# Patient Record
Sex: Male | Born: 2000 | Race: White | Hispanic: No | Marital: Single | Smoking: Never smoker
Health system: Southern US, Community
[De-identification: ages and names within clinical notes are randomized; demographics above are authoritative.]

## PROBLEM LIST (undated history)

## (undated) DIAGNOSIS — M7918 Myalgia, other site: Secondary | ICD-10-CM

## (undated) DIAGNOSIS — G930 Cerebral cysts: Secondary | ICD-10-CM

## (undated) DIAGNOSIS — F329 Major depressive disorder, single episode, unspecified: Secondary | ICD-10-CM

## (undated) DIAGNOSIS — F909 Attention-deficit hyperactivity disorder, unspecified type: Secondary | ICD-10-CM

## (undated) DIAGNOSIS — F410 Panic disorder [episodic paroxysmal anxiety] without agoraphobia: Secondary | ICD-10-CM

## (undated) DIAGNOSIS — R42 Dizziness and giddiness: Secondary | ICD-10-CM

## (undated) DIAGNOSIS — R519 Headache, unspecified: Secondary | ICD-10-CM

## (undated) DIAGNOSIS — I10 Essential (primary) hypertension: Secondary | ICD-10-CM

## (undated) DIAGNOSIS — F32A Depression, unspecified: Secondary | ICD-10-CM

## (undated) DIAGNOSIS — F419 Anxiety disorder, unspecified: Secondary | ICD-10-CM

## (undated) DIAGNOSIS — R51 Headache: Secondary | ICD-10-CM

## (undated) DIAGNOSIS — M359 Systemic involvement of connective tissue, unspecified: Secondary | ICD-10-CM

## (undated) DIAGNOSIS — G894 Chronic pain syndrome: Secondary | ICD-10-CM

## (undated) DIAGNOSIS — F4310 Post-traumatic stress disorder, unspecified: Secondary | ICD-10-CM

## (undated) HISTORY — DX: Major depressive disorder, single episode, unspecified: F32.9

## (undated) HISTORY — DX: Depression, unspecified: F32.A

## (undated) HISTORY — DX: Headache, unspecified: R51.9

## (undated) HISTORY — PX: TONSILLECTOMY AND ADENOIDECTOMY: SUR1326

## (undated) HISTORY — DX: Cerebral cysts: G93.0

## (undated) HISTORY — PX: WISDOM TOOTH EXTRACTION: SHX21

## (undated) HISTORY — DX: Headache: R51

## (undated) HISTORY — DX: Attention-deficit hyperactivity disorder, unspecified type: F90.9

## (undated) HISTORY — DX: Anxiety disorder, unspecified: F41.9

## (undated) HISTORY — DX: Post-traumatic stress disorder, unspecified: F43.10

---
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
## 2019-01-08 ENCOUNTER — Other Ambulatory Visit (INDEPENDENT_AMBULATORY_CARE_PROVIDER_SITE_OTHER): Payer: Self-pay | Admitting: Pediatrics

## 2019-01-08 DIAGNOSIS — R42 Dizziness and giddiness: Secondary | ICD-10-CM

## 2019-01-08 DIAGNOSIS — G43109 Migraine with aura, not intractable, without status migrainosus: Secondary | ICD-10-CM

## 2019-01-26 ENCOUNTER — Ambulatory Visit: Payer: Medicaid Other | Admitting: Child and Adolescent Psychiatry

## 2019-02-06 ENCOUNTER — Other Ambulatory Visit: Payer: Self-pay | Admitting: Child and Adolescent Psychiatry

## 2019-02-06 DIAGNOSIS — G4709 Other insomnia: Secondary | ICD-10-CM

## 2019-02-06 DIAGNOSIS — F900 Attention-deficit hyperactivity disorder, predominantly inattentive type: Secondary | ICD-10-CM

## 2019-02-11 ENCOUNTER — Telehealth: Payer: Self-pay

## 2019-02-11 DIAGNOSIS — F900 Attention-deficit hyperactivity disorder, predominantly inattentive type: Secondary | ICD-10-CM

## 2019-02-11 MED ORDER — LISDEXAMFETAMINE DIMESYLATE 40 MG PO CAPS: 40.0000 mg | ORAL_CAPSULE | ORAL | 0 refills | Status: DC

## 2019-02-11 NOTE — Telephone Encounter (Signed)
Medication refill - message left for patient Dr. Jerold Coombe had sent in a refill of his Vyvanse as requested to his [facility].

## 2019-02-11 NOTE — Telephone Encounter (Signed)
Medication management - Patient left a message he is in need of a refill of his Vyvanse.  States out several days and needs it for school.  Pt. no showed appt. 01/26/19 and is rescheduled for 03/10/19
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
## 2019-02-23 ENCOUNTER — Encounter: Payer: Self-pay | Admitting: Emergency Medicine

## 2019-02-23 ENCOUNTER — Other Ambulatory Visit: Payer: Self-pay

## 2019-02-23 DIAGNOSIS — Z5321 Procedure and treatment not carried out due to patient leaving prior to being seen by health care provider: Secondary | ICD-10-CM | POA: Insufficient documentation

## 2019-02-23 DIAGNOSIS — J029 Acute pharyngitis, unspecified: Secondary | ICD-10-CM | POA: Insufficient documentation

## 2019-02-23 LAB — INFLUENZA PANEL BY PCR (TYPE A & B)
Influenza A By PCR: NEGATIVE
Influenza B By PCR: NEGATIVE

## 2019-02-23 LAB — GROUP A STREP BY PCR: Group A Strep by PCR: NOT DETECTED

## 2019-02-23 NOTE — ED Triage Notes (Signed)
Patient ambulatory to triage with steady gait, without difficulty or distress noted, mask in place; pt reports sore throat, fever, cough & congestion x 3 days; ibuprofen taken PTA

## 2019-02-24 ENCOUNTER — Emergency Department
Admission: EM | Admit: 2019-02-24 | Discharge: 2019-02-24 | Disposition: A | Discharge status: Home / Self Care | Payer: Self-pay | Attending: Emergency Medicine | Admitting: Emergency Medicine

## 2019-03-01 ENCOUNTER — Other Ambulatory Visit: Payer: Self-pay | Admitting: Child and Adolescent Psychiatry

## 2019-03-01 DIAGNOSIS — F418 Other specified anxiety disorders: Secondary | ICD-10-CM

## 2019-03-01 DIAGNOSIS — G4709 Other insomnia: Secondary | ICD-10-CM

## 2019-03-01 DIAGNOSIS — F900 Attention-deficit hyperactivity disorder, predominantly inattentive type: Secondary | ICD-10-CM

## 2019-03-10 ENCOUNTER — Ambulatory Visit: Payer: Medicaid Other | Admitting: Physician Assistant

## 2019-03-15 ENCOUNTER — Other Ambulatory Visit: Payer: Self-pay | Admitting: Child and Adolescent Psychiatry

## 2019-03-15 DIAGNOSIS — F331 Major depressive disorder, recurrent, moderate: Secondary | ICD-10-CM

## 2019-03-16 NOTE — Telephone Encounter (Signed)
Can you please call this patient and request an appointment. I am sending the rx today but please let the patient know that we will not be able to prescribe medication in future without appointment. We can do telepsychiatry appointment.

## 2019-04-03 ENCOUNTER — Other Ambulatory Visit: Payer: Self-pay | Admitting: Child and Adolescent Psychiatry

## 2019-04-03 DIAGNOSIS — F900 Attention-deficit hyperactivity disorder, predominantly inattentive type: Secondary | ICD-10-CM

## 2019-04-04 NOTE — Telephone Encounter (Signed)
Thanks

## 2019-04-04 NOTE — Telephone Encounter (Signed)
Hi Jessica;  I am approving the requested medication for this patient. Can you please call the patient and request to schedule an appointment for follow up evaluation and to be able to continue provide refills in the future.   Thanks

## 2019-04-04 NOTE — Telephone Encounter (Signed)
Pt has appt for the 04-07-19

## 2019-04-07 ENCOUNTER — Other Ambulatory Visit: Payer: Self-pay

## 2019-04-07 ENCOUNTER — Encounter: Payer: Self-pay | Admitting: Child and Adolescent Psychiatry

## 2019-04-07 ENCOUNTER — Ambulatory Visit (INDEPENDENT_AMBULATORY_CARE_PROVIDER_SITE_OTHER): Payer: BLUE CROSS/BLUE SHIELD | Admitting: Child and Adolescent Psychiatry

## 2019-04-07 DIAGNOSIS — F900 Attention-deficit hyperactivity disorder, predominantly inattentive type: Secondary | ICD-10-CM

## 2019-04-07 DIAGNOSIS — F331 Major depressive disorder, recurrent, moderate: Secondary | ICD-10-CM

## 2019-04-07 DIAGNOSIS — F418 Other specified anxiety disorders: Secondary | ICD-10-CM | POA: Diagnosis not present

## 2019-04-07 DIAGNOSIS — G4709 Other insomnia: Secondary | ICD-10-CM

## 2019-04-07 DIAGNOSIS — F4310 Post-traumatic stress disorder, unspecified: Secondary | ICD-10-CM

## 2019-04-07 MED ORDER — HYDROXYZINE HCL 25 MG PO TABS: ORAL_TABLET | ORAL | 0 refills | Status: DC

## 2019-04-07 MED ORDER — LISDEXAMFETAMINE DIMESYLATE 40 MG PO CAPS: 40.0000 mg | ORAL_CAPSULE | ORAL | 0 refills | Status: DC

## 2019-04-07 MED ORDER — QUETIAPINE FUMARATE 25 MG PO TABS: 25.0000 mg | ORAL_TABLET | Freq: Every day | ORAL | 0 refills | Status: DC

## 2019-04-07 MED ORDER — SERTRALINE HCL 50 MG PO TABS: 75.0000 mg | ORAL_TABLET | Freq: Every day | ORAL | 0 refills | Status: DC

## 2019-04-07 MED ORDER — TRAZODONE HCL 100 MG PO TABS: 100.0000 mg | ORAL_TABLET | Freq: Every day | ORAL | 0 refills | Status: DC

## 2019-04-07 MED ORDER — BUPROPION HCL ER (XL) 150 MG PO TB24: ORAL_TABLET | ORAL | 0 refills | Status: DC

## 2019-04-07 NOTE — Progress Notes (Signed)
Virtual Visit via Telephone Note  I connected with Todd Stuart on 04/07/19 at  2:00 PM EDT by telephone and verified that I am speaking with the correct person using two identifiers.   I discussed the limitations, risks, security and privacy concerns of performing an evaluation and management service by telephone and the availability of in person appointments. I also discussed with the patient that there may be a patient responsible charge related to this service. The patient expressed understanding and agreed to proceed.   History of Present Illness: 18 year old with history of anxiety, depression, PTSD, ADHD and sleeping difficulties was evaluated over the telephone for routine follow-up visit.  He reports that he has been having more difficulties with his anxiety, difficulties with his sleep and difficulties with focusing.  He reports that his mood has been stable denies feeling depressed denies anhedonia, denies thoughts of suicide and symptoms of PTSD has improved.  In regards of anxiety he reports that he recently broke up with his girlfriend about 2 to 3 weeks ago which is increased anxiety and contributed to his sleeping difficulties.  He reports that he stays in bed for about 2 or 3 hours before he could fall asleep and reports that he does not have a good quality of sleep.  He reports feeling tired during the day.  He reports that he cannot calm himself down to fall asleep because of  Anxiety and therefore it is hard for him to fall asleep.  He reports that clonidine which was added last visit did not help much.  He reports that he seems to have tolerated Zoloft well and Zoloft has been helpful with his mood along with Wellbutrin.  He reports that he continues to take trazodone and hydroxyzine which has not been helpful as much for sleep.  He also reports that he has been having difficulties with focusing and feels that Vyvanse is not working for him.  We discussed that it could also be  related to his anxiety and poor sleep therefore would recommend to adjust medication for anxiety and sleep before considering medication adjustment for ADHD.  He verbalized understanding.  He reports that he continues to see his therapist every other week.    Observations/Objective:  Appearance: unable to assess since virtual visit was over the telephone Attitude: calm, cooperative Activity: unable to assess since virtual visit was over the telephone Speech: normal rate, rhythm and volume Thought Process: Logical, linear, and goal-directed.  Associations: no looseness, tangentiality, circumstantiality, flight of ideas, thought blocking or word salad noted Thought Content: (abnormal/psychotic thoughts): no abnormal or delusional thought process evidenced SI/HI: denies Si/Hi Perception: no illusions or visual/auditory hallucinations noted; Mood & Affect: "good"/unable to assess since virtual visit was over the telephone  Judgment & Insight: both fair Attention and Concentration : Good Cognition : WNL Language : Good ADL - Intact    Assessment and Plan:  Todd Stuart is an 18 year old malewith complex medical and psychiatric hx. 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 He has improvement in his depression with Zoloft but reports worsening of anxiety and sleeping difficulties along with poor attention.   Treatment Plan Summary: Problem 1:Anxiety; Worse Plan:- Increase Zoloft to 75 mg Qdaily.           - Continue with Wellbutrin XL 150 mg daily since pt had responded well perviously. Continue to monitor BP          -  Recommending Atarax 12.5-25 mg TID PRN for anxiety/sleep          - Continue with Trazodone 100 mg QHS for sleep - Continue Ind therapy with Ms. Sprouse([phone])every 2 weeks, pt has been regularly seeing therapist according to  him  Problem 2:Depression; partially improving Plan:- Continue Zoloft and wellbutrin as mentioned above along with continuing ind therapy every week  Problem 3:PTSD: Improving Plan:- Continue Zoloft and Wellbutrin as mentioned above along with ind therapy.  Problem 4:ADHD: Stable Plan:- Continue Vyvanse 40 mg once daily, VS reviewed and WNL - PMP AWARE checked prior to sending rx, no abuse or misuse noted.   Problem 5: Sleeping difficulties; Worse Plan: - Stop Clonidine 0.1 mg QHS          -  Start Seroquel 25 mg QHS, discussed risks and benefits including metabolic side effects and EPS. Pt provided informed consent.           - Continue Trazodone 100 mg QHS, side effects including but not limited to priapism discussed. Pt provided informed consent. - Atarax upto 25 mg QHS.  30 minutes with patient and more than 50% time spent on counseling and coordination of care as mentioned above.   Follow Up Instructions:    I discussed the assessment and treatment plan with the patient. The patient was provided an opportunity to ask questions and all were answered. The patient agreed with the plan and demonstrated an understanding of the instructions.   The patient was advised to call back or seek an in-person evaluation if the symptoms worsen or if the condition fails to improve as anticipated.  I provided 30 minutes of non-face-to-face time during this encounter.   Darcel SmallingHiren M Domenica Weightman, MD

## 2019-04-12 ENCOUNTER — Telehealth: Payer: Self-pay | Admitting: Physician Assistant

## 2019-04-12 ENCOUNTER — Ambulatory Visit: Payer: Self-pay | Admitting: Physician Assistant

## 2019-04-12 NOTE — Telephone Encounter (Signed)
Went to [email or web address] site, patient was not in waiting room at 11:18 am for appointment. Our office has been calling him multiple times without answer. Called at 11:18 AM AM and patient answered, I introduced myself and patient immediately starts talking about his issues. I informed patient that he is not on the correct forum for visit. I informed patient that he would have to be seen in person to establish, he has appointment on 05/10/2019. He would like to keep this.

## 2019-05-10 ENCOUNTER — Ambulatory Visit: Payer: Self-pay | Admitting: Physician Assistant

## 2019-05-10 ENCOUNTER — Other Ambulatory Visit: Payer: Self-pay | Admitting: Child and Adolescent Psychiatry

## 2019-05-10 DIAGNOSIS — G4709 Other insomnia: Secondary | ICD-10-CM

## 2019-05-10 DIAGNOSIS — F418 Other specified anxiety disorders: Secondary | ICD-10-CM

## 2019-05-11 ENCOUNTER — Ambulatory Visit: Payer: Medicaid Other | Admitting: Child and Adolescent Psychiatry

## 2019-05-11 ENCOUNTER — Other Ambulatory Visit: Payer: Self-pay

## 2019-08-26 ENCOUNTER — Ambulatory Visit (LOCAL_COMMUNITY_HEALTH_CENTER): Payer: Self-pay

## 2019-08-26 ENCOUNTER — Other Ambulatory Visit: Payer: Self-pay

## 2019-08-26 DIAGNOSIS — Z111 Encounter for screening for respiratory tuberculosis: Secondary | ICD-10-CM

## 2019-08-29 ENCOUNTER — Ambulatory Visit (LOCAL_COMMUNITY_HEALTH_CENTER): Payer: Medicaid Other

## 2019-08-29 ENCOUNTER — Other Ambulatory Visit: Payer: Self-pay

## 2019-08-29 DIAGNOSIS — Z111 Encounter for screening for respiratory tuberculosis: Secondary | ICD-10-CM

## 2019-08-29 LAB — TB SKIN TEST
Induration: 0 mm
TB Skin Test: NEGATIVE

## 2020-03-23 ENCOUNTER — Ambulatory Visit (INDEPENDENT_AMBULATORY_CARE_PROVIDER_SITE_OTHER): Payer: Medicaid Other | Admitting: Internal Medicine

## 2020-03-23 ENCOUNTER — Encounter: Payer: Self-pay | Admitting: Internal Medicine

## 2020-03-23 ENCOUNTER — Other Ambulatory Visit: Payer: Self-pay

## 2020-03-23 DIAGNOSIS — F900 Attention-deficit hyperactivity disorder, predominantly inattentive type: Secondary | ICD-10-CM

## 2020-03-23 DIAGNOSIS — I10 Essential (primary) hypertension: Secondary | ICD-10-CM | POA: Diagnosis not present

## 2020-03-23 DIAGNOSIS — F411 Generalized anxiety disorder: Secondary | ICD-10-CM | POA: Diagnosis not present

## 2020-03-23 MED ORDER — LISINOPRIL 2.5 MG PO TABS: 2.5000 mg | ORAL_TABLET | Freq: Every day | ORAL | 3 refills | Status: DC

## 2020-03-23 MED ORDER — CLONIDINE HCL 0.1 MG PO TABS: 0.1000 mg | ORAL_TABLET | Freq: Every day | ORAL | 3 refills | Status: AC

## 2020-03-23 MED ORDER — DULOXETINE HCL 60 MG PO CPEP: 60.0000 mg | ORAL_CAPSULE | Freq: Every day | ORAL | 1 refills | Status: DC

## 2020-03-23 NOTE — Progress Notes (Signed)
Subjective:    Patient ID: Todd Stuart, male    DOB: February 10, 2001, 19 y.o.   MRN: [id number]  HPI Here to establish care This visit occurred during the SARS-CoV-2 public health emergency.  Safety protocols were in place, including screening questions prior to the visit, additional usage of staff PPE, and extensive cleaning of exam room while observing appropriate contact time as indicated for disinfecting solutions.   Has been hospitalized in the past for high blood pressure [facility] No clear diagnosis---but ?Ehlers-Danlos Has done pretty well with his dual Rx  Also with chronic anxiety Will have sweats, etc This is now affecting his school This affects his sleep---the clonidine seems to help that No regular depression Cymbalta, zoloft, prozac in the past  Also diagnosed with ADHD vyvanse in the past---but off this  Currently in [employer] Hopes to go into nursing  Current Outpatient Medications on File Prior to Visit  Medication Sig Dispense Refill  . cloNIDine (CATAPRES) 0.1 MG tablet Take 1 tablet by mouth daily.    Marland Kitchen lisinopril (ZESTRIL) 2.5 MG tablet Take 2.5 mg by mouth daily.     No current facility-administered medications on file prior to visit.    Allergies  Allergen Reactions  . Indomethacin Itching, Nausea Only and Shortness Of Breath    Patient saw PCP who advised stopping medication.    Past Medical History:  Diagnosis Date  . ADHD (attention deficit hyperactivity disorder)   . Amplified musculoskeletal pain syndrome   . Anxiety   . 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 Chronic pain syndrome   . Connective tissue disease ([place])   . Depression   . Disequilibrium   . Headache   . Hypertension   . Hypertension   . Panic attack   . PTSD (post-traumatic stress disorder)     Past Surgical History:  Procedure Laterality Date  . TONSILLECTOMY AND ADENOIDECTOMY    . WISDOM TOOTH EXTRACTION      Family History  Problem Relation Age  of Onset  . Depression Mother   . Headache Sister   . Seizures Neg Hx   . Anxiety disorder Neg Hx   . Bipolar disorder Neg Hx   . Schizophrenia Neg Hx   . ADD / ADHD Neg Hx   . Autism Neg Hx     Social History   Socioeconomic History  . Marital status: Single    Spouse name: Not on file  . Number of children: Not on file  . Years of education: Not on file  . Highest education level: Not on file  Occupational History  . Occupation: Electronics engineer  Tobacco Use  . Smoking status: Never Smoker  . Smokeless tobacco: Never Used  Substance and Sexual Activity  . Alcohol use: Never  . Drug use: Never  . Sexual activity: Yes    Birth control/protection: Condom  Other Topics Concern  . Not on file  Social History Narrative   Chester Holstein currently in 1st year of college ; He lives with his mother, step-father, and siblings.       He enjoys running, watch TV, studying and working when feeling well.       Social Determinants of Health   Financial Resource Strain:   . Difficulty of Paying Living Expenses:   Food Insecurity:   . Worried About Charity fundraiser in the Last Year:   . Ryan in the Last Year:  Transportation Needs:   . Freight forwarder (Medical):   Marland Kitchen Lack of Transportation (Non-Medical):   Physical Activity:   . Days of Exercise per Week:   . Minutes of Exercise per Session:   Stress:   . Feeling of Stress :   Social Connections:   . Frequency of Communication with Friends and Family:   . Frequency of Social Gatherings with Friends and Family:   . Attends Religious Services:   . Active Member of Clubs or Organizations:   . Attends Banker Meetings:   Marland Kitchen Marital Status:   Intimate Partner Violence:   . Fear of Current or Ex-Partner:   . Emotionally Abused:   Marland Kitchen Physically Abused:   . Sexually Abused:    Review of Systems In relationship---uses condoms regularly No sexual issues Walks regularly Not sleeping well--relates to  anxiety (tries melatonin)    Objective:   Physical Exam  Constitutional: He appears well-developed. No distress.  Neck: No thyromegaly present.  Cardiovascular: Normal rate, regular rhythm, normal heart sounds and intact distal pulses. Exam reveals no gallop.  No murmur heard. Respiratory: Effort normal and breath sounds normal. No respiratory distress. He has no wheezes. He has no rales.  GI: Soft. There is no abdominal tenderness.  Musculoskeletal:        General: No edema.  Lymphadenopathy:    He has no cervical adenopathy.  Psychiatric: He has a normal mood and affect. His behavior is normal.           Assessment & Plan:

## 2020-03-23 NOTE — Assessment & Plan Note (Signed)
Will restart duloxetine--try 60mg  but may need to increase

## 2020-03-23 NOTE — Assessment & Plan Note (Signed)
Prefers no vyvanse Doing okay at [facility] now Clonidine at bedtime may help some

## 2020-03-23 NOTE — Patient Instructions (Signed)
Please start the duloxetine with 1 capsule every other day for the first week or so. If you don't have any problems, increase to the full capsule daily

## 2020-03-23 NOTE — Assessment & Plan Note (Signed)
BP Readings from Last 3 Encounters:  03/23/20 104/76  02/23/19 125/81  08/09/18 111/73   Has done well with current low dose regimen

## 2020-03-25 MED ORDER — ONDANSETRON HCL 4 MG PO TABS: 4.0000 mg | ORAL_TABLET | Freq: Three times a day (TID) | ORAL | 1 refills | Status: AC | PRN

## 2020-03-25 NOTE — Addendum Note (Signed)
Addended by: Tillman Abide I on: 03/25/2020 01:22 PM   Modules accepted: Orders

## 2020-03-29 ENCOUNTER — Telehealth: Payer: Self-pay | Admitting: Internal Medicine

## 2020-03-29 MED ORDER — BUSPIRONE HCL 10 MG PO TABS: 10.0000 mg | ORAL_TABLET | Freq: Three times a day (TID) | ORAL | 1 refills | Status: DC

## 2020-03-29 NOTE — Telephone Encounter (Signed)
Spoke to pt. He will let us know if he has any issues 

## 2020-03-29 NOTE — Telephone Encounter (Signed)
Pt called and left a voicemail requesting an alternative medication. Pt was started on Cymbalta on Friday 4/9 and states hat he is not comfortable taking this medication d/t the way it made him feel - he states that it made him feel very bad and he did not like it. Wondering if there is something else that can be sent in.   Please advise, thanks.

## 2020-03-29 NOTE — Telephone Encounter (Signed)
Let him know I sent a prescription for buspirone---which is only for anxiety. Have him start with 1 at bedtime for 3 days--then increase to twice a day (morning /evening) and finally three times a day if no side effects (after 1-2 weeks). Some people will feel a little tired a while after taking it Have him let me know if he is having any problems

## 2020-04-04 MED ORDER — TRAZODONE HCL 50 MG PO TABS: 50.0000 mg | ORAL_TABLET | Freq: Every day | ORAL | 1 refills | Status: DC

## 2020-04-23 ENCOUNTER — Ambulatory Visit: Payer: Medicaid Other | Admitting: Internal Medicine

## 2020-04-26 ENCOUNTER — Telehealth: Payer: Self-pay

## 2020-04-26 NOTE — Telephone Encounter (Signed)
I called pt and unable to reach by phone. Per access nurse note pt will go to  [facility] ED.

## 2020-04-26 NOTE — Telephone Encounter (Signed)
[facility] Day - Client TELEPHONE ADVICE RECORD Access[facility]urse Patient Name: Todd Stuart Gender: Male DOB: [birth year] Age: 19 Y 3 M 13 D Return Phone Number: [phone] (Primary) Address: City/State/Zip: [place] [address] Client [facility] Day - Client Client Site [facility] - Day Physician Tillman Abide - MD Contact Type Call Who Is Calling Patient / Member / Family / Caregiver Call Type Triage / Clinical Relationship To Patient Self Return Phone Number [phone] (Primary) Chief Complaint BREATHING - shortness of breath or sounds breathless Reason for Call Symptomatic / Request for Health Information Initial Comment Caller states he is having a sore throat, SOB, and chest tightness. GOTO Facility Not Listed [facility] ED Translation No Nurse Assessment Nurse: Odis Luster, RN, Bjorn Loser Date/Time (Eastern Time): 04/26/2020 8:53:35 AM Confirm and document reason for call. If symptomatic, describe symptoms. ---Caller states he is having a sore throat, SOB, and chest tightness. s/s began about 5 days ago. Work in an ED. Has the patient had close contact with a person known or suspected to have the novel coronavirus illness OR traveled / lives in area with major community spread (including international travel) in the last 14 days from the onset of symptoms? * If Asymptomatic, screen for exposure and travel within the last 14 days. ---No Does the patient have any new or worsening symptoms? ---Yes Will a triage be completed? ---Yes Related visit to physician within the last 2 weeks? ---No Does the PT have any chronic conditions? (i.e. diabetes, asthma, this includes High risk factors for pregnancy, etc.) ---Yes List chronic conditions. ---HTN Is this a behavioral health or substance abuse call? ---No Guidelines Guideline Title Affirmed Question Affirmed Notes Nurse Date/Time Lamount Cohen Time) Chest  Pain Dizziness or lightheadedness Odis Luster, RN, Bjorn Loser 04/26/2020 8:56:21 AM Disp. Time Lamount Cohen Time) Disposition Final User 04/26/2020 8:51:23 AM Send to Urgent Alphonsa Gin PLEASE NOTE: All timestamps contained within this report are represented as [place] Standard Time. CONFIDENTIALTY NOTICE: This fax transmission is intended only for the addressee. It contains information that is legally privileged, confidential or otherwise protected from use or disclosure. If you are not the intended recipient, you are strictly prohibited from reviewing, disclosing, copying using or disseminating any of this information or taking any action in reliance on or regarding this information. If you have received this fax in error, please notify us immediately by telephone so that we can arrange for its return to [place]. Phone: [phone], Toll-Free: [phone], Fax: [phone] Page: 2 of 2 Call Id: [id number] 04/26/2020 8:58:36 AM Go to ED Now Yes Odis Luster, RN, Juliene Pina Disagree/Comply Comply Caller Understands Yes PreDisposition Go to ED Care Advice Given Per Guideline GO TO ED NOW: * You need to be seen in the Emergency Department. * Go to the ED at ___________ Hospital. * Leave now. Drive carefully. NOTE TO TRIAGER - DRIVING: * Another adult should drive. BRING MEDICINES: * Please bring a list of your current medicines when you go to the Emergency Department (ER). * It is also a good idea to bring the pill bottles too. This will help the doctor to make certain you are taking the right medicines and the right dose. CALL EMS IF: * Severe difficulty breathing occurs * Passes out or becomes too weak to stand * You become worse. CARE ADVICE given per Chest Pain (Adult) guideline. Referrals GO TO FACILITY OTHER - SPECIFY

## 2020-04-26 NOTE — Telephone Encounter (Signed)
Looks like he is at [facility] walk in now Please check on him tomorrow Also need to reschedule the visit he no showed for

## 2020-04-27 NOTE — Telephone Encounter (Signed)
Left a message for the pt to return my call.  

## 2020-04-27 NOTE — Telephone Encounter (Signed)
I spoke with pt and he is presently at [facility] ED.

## 2020-04-27 NOTE — Telephone Encounter (Signed)
[facility] Day - Client TELEPHONE ADVICE RECORD Access[facility] Patient Name: Todd Stuart Gender: Male DOB: May 22, 2001 Age: 19 Y 3 M 14 D Return Phone Number: [phone] (Primary) Address: City/State/Zip: [place] [address] Client [facility] Day - Client Client Site [facility] - Day Physician Tillman Abide - MD Contact Type Call Who Is Calling Patient / Member / Family / Caregiver Call Type Triage / Clinical Relationship To Patient Self Return Phone Number [phone] (Primary) Chief Complaint CHEST PAIN (>=21 years) - pain, pressure, heaviness or tightness Reason for Call Symptomatic / Request for Health Information Initial Comment Caller was told to go to urgent care and he was negative for COVID and Pneumonia. Caller reports a fever of 100, cold chills, and body aches. Caller also reports chest congestion. Caller reports chest pain. Caller reports it is causing back pain. The office did not have an appointment available. GOTO Facility Not Listed [facility] ER. Translation No Nurse Assessment Nurse: Thurmond Butts, RN, Meriam Sprague Date/Time (Eastern Time): 04/27/2020 12:07:52 PM Confirm and document reason for call. If symptomatic, describe symptoms. ---Caller states he went to the urgent care and was negative for covid and pneumonia. He has bad back pain and has had some chest pain. Not having it now. He keeps worsening. Fever and chills. He needs a note for next week at work. Coughing. Last temp 100 temporal. very fatigued. Has the patient had close contact with a person known or suspected to have the novel coronavirus illness OR traveled / lives in area with major community spread (including international travel) in the last 14 days from the onset of symptoms? * If Asymptomatic, screen for exposure and travel within the last 14 days. ---No Does the patient have any new or worsening symptoms?  ---Yes Will a triage be completed? ---Yes Related visit to physician within the last 2 weeks? ---Yes Does the PT have any chronic conditions? (i.e. diabetes, asthma, this includes High risk factors for pregnancy, etc.) ---Yes List chronic conditions. ---asthma Is this a behavioral health or substance abuse call? ---No PLEASE NOTE: All timestamps contained within this report are represented as [place] Standard Time. CONFIDENTIALTY NOTICE: This fax transmission is intended only for the addressee. It contains information that is legally privileged, confidential or otherwise protected from use or disclosure. If you are not the intended recipient, you are strictly prohibited from reviewing, disclosing, copying using or disseminating any of this information or taking any action in reliance on or regarding this information. If you have received this fax in error, please notify us immediately by telephone so that we can arrange for its return to [place]. Phone: [phone], Toll-Free: [phone], Fax: [phone] Page: 2 of 2 Call Id: [id number] Guidelines Guideline Title Affirmed Question Affirmed Notes Nurse Date/Time Lamount Cohen Time) COVID-19 - Diagnosed or Suspected SEVERE or constant chest pain or pressure (Exception: mild central chest pain, present only when coughing) Thurmond Butts, RN, [facility] 04/27/2020 12:11:58 PM Disp. Time Lamount Cohen Time) Disposition Final User 04/27/2020 12:06:52 PM Send to Urgent Queue Simon Rhein 04/27/2020 12:13:38 PM Go to ED Now Yes Thurmond Butts, RN, Diamantina [facility] Disagree/Comply Comply Caller Understands Yes PreDisposition Call Doctor Care Advice Given Per Guideline GO TO ED NOW: CALL EMS 911 IF: * Severe difficulty breathing occurs * Lips or face turns blue * Confusion occurs. CARE ADVICE given per COVID-19 - DIAGNOSED OR SUSPECTED (Adult) guideline. Referrals GO TO FACILITY OTHER - SPECIFY

## 2020-04-29 NOTE — Telephone Encounter (Signed)
Please check on him on Monday We still need to reschedule his follow up about the medication effectiveness It can be virtual

## 2020-04-30 NOTE — Telephone Encounter (Signed)
Left message on VM stating I was calling to see how he was doing after recent ER visit and to R/S the appt he missed th other day.

## 2020-05-02 NOTE — Telephone Encounter (Signed)
Pt has been called multiple times and is not returning our calls.

## 2020-05-04 MED ORDER — BENZONATATE 200 MG PO CAPS: 200.0000 mg | ORAL_CAPSULE | Freq: Three times a day (TID) | ORAL | 0 refills | Status: DC | PRN

## 2020-05-04 NOTE — Telephone Encounter (Signed)
He can make an appt with another provider to be seen, otherwise will defer to PCP

## 2020-05-22 ENCOUNTER — Other Ambulatory Visit: Payer: Self-pay

## 2020-05-22 ENCOUNTER — Encounter: Payer: Self-pay | Admitting: Internal Medicine

## 2020-05-22 ENCOUNTER — Ambulatory Visit (INDEPENDENT_AMBULATORY_CARE_PROVIDER_SITE_OTHER): Payer: Medicaid Other | Admitting: Internal Medicine

## 2020-05-22 DIAGNOSIS — F411 Generalized anxiety disorder: Secondary | ICD-10-CM | POA: Diagnosis not present

## 2020-05-22 DIAGNOSIS — G479 Sleep disorder, unspecified: Secondary | ICD-10-CM | POA: Diagnosis not present

## 2020-05-22 DIAGNOSIS — J4540 Moderate persistent asthma, uncomplicated: Secondary | ICD-10-CM

## 2020-05-22 MED ORDER — TRAZODONE HCL 100 MG PO TABS: 100.0000 mg | ORAL_TABLET | Freq: Every day | ORAL | 1 refills | Status: DC

## 2020-05-22 MED ORDER — TRAZODONE HCL 100 MG PO TABS: 200.0000 mg | ORAL_TABLET | Freq: Every day | ORAL | 1 refills | Status: DC

## 2020-05-22 MED ORDER — BUSPIRONE HCL 10 MG PO TABS: 10.0000 mg | ORAL_TABLET | Freq: Three times a day (TID) | ORAL | 1 refills | Status: DC

## 2020-05-22 MED ORDER — FLUTICASONE-SALMETEROL 250-50 MCG/DOSE IN AEPB: 1.0000 | INHALATION_SPRAY | Freq: Two times a day (BID) | RESPIRATORY_TRACT | 5 refills | Status: AC

## 2020-05-22 NOTE — Progress Notes (Signed)
Subjective:    Patient ID: Todd Stuart, male    DOB: 11-07-2001, 19 y.o.   MRN: [id number]  HPI Here for follow up of anxiety and sleep problems This visit occurred during the SARS-CoV-2 public health emergency.  Safety protocols were in place, including screening questions prior to the visit, additional usage of staff PPE, and extensive cleaning of exam room while observing appropriate contact time as indicated for disinfecting solutions.   Didn't like the duloxetine--felt "really weird, tired" Changed to buspirone 10mg  bid Hasn't seen much of a difference in anxiety with this--but no apparent side effects Ongoing sleep issues are also a problem Started trazodone shortly after---did have good response at first but it waned. 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 busPIRone (BUSPAR) 10 MG tablet Take 1 tablet (10 mg total) by mouth 3 (three) times daily. 90 tablet 1  . cloNIDine (CATAPRES) 0.1 MG tablet Take 1 tablet (0.1 mg total) by mouth at bedtime. 90 tablet 3  . lisinopril (ZESTRIL) 2.5 MG tablet Take 1 tablet (2.5 mg total) by mouth daily. 90 tablet 3  . ondansetron (ZOFRAN) 4 MG tablet Take 1 tablet (4 mg total) by mouth every 8 (eight) hours as needed for nausea or vomiting. 20 tablet 1  . traZODone (DESYREL) 50 MG tablet Take 1-2 tablets (50-100 mg total) by mouth at bedtime. 60 tablet 1   No current facility-administered medications on file prior to visit.    Allergies  Allergen Reactions  . Indomethacin Itching, Nausea Only and Shortness Of Breath    Patient saw PCP who advised stopping  medication.    Past Medical History:  Diagnosis Date  . ADHD (attention deficit hyperactivity disorder)   . Amplified musculoskeletal pain syndrome   . Anxiety   . 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 Chronic pain syndrome   . Connective tissue disease ([place])   . Depression   . Disequilibrium   . Headache   . Hypertension   . Hypertension   . Panic attack   . PTSD (post-traumatic stress disorder)     Past Surgical History:  Procedure Laterality Date  . TONSILLECTOMY AND ADENOIDECTOMY    . WISDOM TOOTH EXTRACTION      Family History  Problem Relation Age of Onset  . Depression Mother   . Depression Father   . Anxiety disorder Father   . Headache Sister   . Seizures Neg Hx   . Bipolar disorder Neg Hx   . Schizophrenia Neg Hx   . ADD / ADHD Neg Hx   . Autism Neg Hx     Social History   Socioeconomic History  . Marital status: Single    Spouse name: Not on file  . Number of children: Not on file  . Years of education: Not on file  . Highest education level: Not on file  Occupational History  . Occupation: Electronics engineer  Tobacco Use  . Smoking status: Never Smoker  . Smokeless tobacco: Never Used  Substance and Sexual Activity  . Alcohol use: Never  .  Drug use: Never  . Sexual activity: Yes    Birth control/protection: Condom  Other Topics Concern  . Not on file  Social History Narrative   Todd Stuart currently in 1st year of college ; He lives with his mother, step-father, and siblings.       He enjoys running, watch TV, studying and working when feeling well.       Social Determinants of Health   Financial Resource Strain:   . Difficulty of Paying Living Expenses:   Food Insecurity:   . Worried About Programme researcher, broadcasting/film/video in the Last Year:   . Barista in the Last Year:   Transportation Needs:   . Freight forwarder (Medical):   Marland Kitchen Lack of Transportation (Non-Medical):   Physical Activity:   . Days of Exercise per Week:   .  Minutes of Exercise per Session:   Stress:   . Feeling of Stress :   Social Connections:   . Frequency of Communication with Friends and Family:   . Frequency of Social Gatherings with Friends and Family:   . Attends Religious Services:   . Active Member of Clubs or Organizations:   . Attends Banker Meetings:   Marland Kitchen Marital Status:   Intimate Partner Violence:   . Fear of Current or Ex-Partner:   . Emotionally Abused:   Marland Kitchen Physically Abused:   . Sexually Abused:    Review of Systems  Appetite is up some--has gained a couple of pounds Continues to exercise regularly In relationship now--this is good     Objective:   Physical Exam  Constitutional: No distress.  Respiratory: Effort normal and breath sounds normal. No respiratory distress. He has no wheezes. He has no rales.  Psychiatric:  Normal interaction No depression Not overtly anxious now           Assessment & Plan:

## 2020-05-22 NOTE — Assessment & Plan Note (Signed)
Not much response to buspirone Will try increasing to 10 tid (and consider further increase to 15 tid)

## 2020-05-22 NOTE — Assessment & Plan Note (Signed)
Now using the albuterol daily Will restart advair

## 2020-05-22 NOTE — Assessment & Plan Note (Signed)
Chronic Will try increasing the trazodone to 200mg  at bedtime Could try mirtazapine if that is ineffective (and be careful about appetite)

## 2020-06-21 ENCOUNTER — Other Ambulatory Visit: Payer: Self-pay

## 2020-06-21 ENCOUNTER — Telehealth (INDEPENDENT_AMBULATORY_CARE_PROVIDER_SITE_OTHER): Payer: Medicaid Other | Admitting: Internal Medicine

## 2020-06-21 ENCOUNTER — Ambulatory Visit: Payer: Medicaid Other | Admitting: Internal Medicine

## 2020-06-21 ENCOUNTER — Encounter: Payer: Self-pay | Admitting: Internal Medicine

## 2020-06-21 DIAGNOSIS — F411 Generalized anxiety disorder: Secondary | ICD-10-CM

## 2020-06-21 MED ORDER — FLUOXETINE HCL 10 MG PO CAPS: 10.0000 mg | ORAL_CAPSULE | Freq: Every day | ORAL | 3 refills | Status: DC

## 2020-06-21 NOTE — Progress Notes (Signed)
Subjective:    Patient ID: Todd Stuart, male    DOB: 09/30/2001, 19 y.o.   MRN: [id number]  HPI Video virtual visit for follow up of his anxiety Identification done Reviewed limitations and billing and he gave consent Participants---patient in the family [facility] office and I am in my office  Initially had video contact but then it was lost so we finished on phone only  Did feel his heart was racing and he was more anxious on the buspirone Had more trouble sleeping These symptoms are better since he stopped  Still with the chronic anxiety---but not the sweating and palpitations On the trazodone 200mg  at bedtime---sleeping better Has some sad days--trouble motivating (but nothing serious)  Total time of call 12 minutes  Current Outpatient Medications on File Prior to Visit  Medication Sig Dispense Refill   cloNIDine (CATAPRES) 0.1 MG tablet Take 1 tablet (0.1 mg total) by mouth at bedtime. 90 tablet 3   Fluticasone-Salmeterol (ADVAIR) 250-50 MCG/DOSE AEPB Inhale 1 puff into the lungs in the morning and at bedtime. 60 each 5   lisinopril (ZESTRIL) 2.5 MG tablet Take 1 tablet (2.5 mg total) by mouth daily. 90 tablet 3   ondansetron (ZOFRAN) 4 MG tablet Take 1 tablet (4 mg total) by mouth every 8 (eight) hours as needed for nausea or vomiting. 20 tablet 1   traZODone (DESYREL) 100 MG tablet Take 2 tablets (200 mg total) by mouth at bedtime. 60 tablet 1   No current facility-administered medications on file prior to visit.    Allergies  Allergen Reactions   Indomethacin Itching, Nausea Only and Shortness Of Breath    Patient saw PCP who advised stopping medication.    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   Chronic pain syndrome    Connective tissue disease (HCC)    Depression    Disequilibrium      Headache    Hypertension    Hypertension    Panic attack    PTSD (post-traumatic stress disorder)     Past Surgical History:  Procedure Laterality Date   TONSILLECTOMY AND ADENOIDECTOMY     WISDOM TOOTH EXTRACTION      Family History  Problem Relation Age of Onset   Depression Mother    Depression Father    Anxiety disorder Father    Headache Sister    Seizures Neg Hx    Bipolar disorder Neg Hx    Schizophrenia Neg Hx    ADD / ADHD Neg Hx    Autism Neg Hx     Social History   Socioeconomic History   Marital status: Single    Spouse name: Not on file   Number of children: Not on file   Years of education: Not on file   Highest education level: Not on file  Occupational History   Occupation:  Tobacco Use   Smoking status: Never Smoker   Smokeless tobacco: Never Used  Archivist Use: Never used  Substance and Sexual Activity   Alcohol use: Never   Drug use: Never   Sexual activity: Yes    Birth control/protection: Condom  Other Topics Concern   Not on file  Social History Narrative   [name] currently in 1st year of college ; He lives with his mother,  step-father, and siblings.       He enjoys running, watch TV, studying and working when feeling well.       Social Determinants of Health   Financial Resource Strain:    Difficulty of Paying Living Expenses:   Food Insecurity:    Worried About Programme researcher, broadcasting/film/video in the Last Year:    Barista in the Last Year:   Transportation Needs:    Freight forwarder (Medical):    Lack of Transportation (Non-Medical):   Physical Activity:    Days of Exercise per Week:    Minutes of Exercise per Session:   Stress:    Feeling of Stress :   Social Connections:    Frequency of Communication with Friends and Family:    Frequency of Social Gatherings with Friends and Family:    Attends Religious Services:    Active Member of Clubs or  Organizations:    Attends Banker Meetings:    Marital Status:   Intimate Partner Violence:    Fear of Current or Ex-Partner:    Emotionally Abused:    Physically Abused:    Sexually Abused:     Review of Systems Appetite is about the same May have lost a little weight Still taking classes and this is going okay---hopes to apply to nursing program in January    Objective:   Physical Exam Constitutional:      Appearance: Normal appearance.  Neurological:     Mental Status: He is alert.  Psychiatric:        Mood and Affect: Mood normal.        Behavior: Behavior normal.            Assessment & Plan:

## 2020-06-21 NOTE — Assessment & Plan Note (Signed)
Actually had more physical anxiety with the buspirone Now tells me he thinks he did well with prozac Will try low dose Recheck 2 months He will email if any problems

## 2020-07-16 ENCOUNTER — Other Ambulatory Visit: Payer: Self-pay | Admitting: Internal Medicine

## 2020-08-10 ENCOUNTER — Other Ambulatory Visit: Payer: Self-pay | Admitting: Internal Medicine

## 2020-08-13 NOTE — Telephone Encounter (Signed)
Last office visit 06/21/2020 for GAD.  Last refilled 07/16/2020 for #60 with no refills.  Next Appt: 08/16/2020 for 2 month follow.  Ok to refill?

## 2020-08-16 ENCOUNTER — Encounter: Payer: Self-pay | Admitting: Internal Medicine

## 2020-08-16 ENCOUNTER — Telehealth (INDEPENDENT_AMBULATORY_CARE_PROVIDER_SITE_OTHER): Payer: Medicaid Other | Admitting: Internal Medicine

## 2020-08-16 ENCOUNTER — Other Ambulatory Visit: Payer: Self-pay

## 2020-08-16 ENCOUNTER — Telehealth: Payer: Self-pay | Admitting: Internal Medicine

## 2020-08-16 DIAGNOSIS — F411 Generalized anxiety disorder: Secondary | ICD-10-CM | POA: Diagnosis not present

## 2020-08-16 MED ORDER — FLUOXETINE HCL 20 MG PO TABS: 20.0000 mg | ORAL_TABLET | Freq: Every day | ORAL | 0 refills | Status: AC

## 2020-08-16 NOTE — Telephone Encounter (Signed)
I left a message on patient's voice mail to return my call.  Patient needs to schedule an in office 2 month follow up appointment.

## 2020-08-16 NOTE — Assessment & Plan Note (Signed)
He has tolerated the fluoxetine fine----and some reduction in anxiety (but not a lot) Still using the trazodone for sleep Will increase the fluoxetine to 20mg  Plan follow up in the office in about 2 months--he will let me know if he has any problems on the higher dose

## 2020-08-16 NOTE — Progress Notes (Signed)
Subjective:    Patient ID: Todd Stuart, male    DOB: [birth year], 19 y.o.   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 cloNIDine (CATAPRES) 0.1 MG tablet Take 1 tablet (0.1 mg total) by mouth at bedtime. 90 tablet 3  . FLUoxetine (PROZAC) 10 MG capsule Take 1 capsule (10 mg total) by mouth daily. 30 capsule 3  . Fluticasone-Salmeterol (ADVAIR) 250-50 MCG/DOSE AEPB Inhale 1 puff into the lungs in the morning and at bedtime. 60 each 5  . lisinopril (ZESTRIL) 2.5 MG tablet Take 1 tablet (2.5 mg total) by mouth daily. 90 tablet 3  . ondansetron (ZOFRAN) 4 MG tablet Take 1 tablet (4 mg total) by mouth every 8 (eight) hours as needed for nausea or vomiting. 20 tablet 1  . traZODone (DESYREL) 100 MG tablet TAKE 2 TABLETS(200 MG) BY MOUTH AT BEDTIME 60 tablet 0   No current facility-administered medications on file prior to visit.    Allergies  Allergen Reactions  . Indomethacin Itching, Nausea Only and Shortness Of Breath    Patient saw PCP who advised stopping medication.    Past Medical History:  Diagnosis Date  . ADHD (attention deficit hyperactivity disorder)   . Amplified musculoskeletal pain syndrome   . Anxiety   . 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 Chronic pain syndrome   . Connective tissue disease (H[facility])   . Depression   . Disequilibrium   . Headache   . Hypertension   . Hypertension   . Panic attack   . PTSD  (post-traumatic stress disorder)     Past Surgical History:  Procedure Laterality Date  . TONSILLECTOMY AND ADENOIDECTOMY    . WISDOM TOOTH EXTRACTION      Family History  Problem Relation Age of Onset  . Depression Mother   . Depression Father   . Anxiety disorder Father   . Headache Sister   . Seizures Neg Hx   . Bipolar disorder Neg Hx   . Schizophrenia Neg Hx   . ADD / ADHD Neg Hx   . Autism Neg Hx     Social History   Socioeconomic History  . Marital status: Single    Spouse name: Not on file  . Number of children: Not on file  . Years of education: Not on file  . Highest education level: Not on file  Occupational History  . Occupation: Archivist  Tobacco Use  . Smoking status: Never Smoker  . Smokeless tobacco: Never Used  Vaping Use  . Vaping Use: Never used  Substance and Sexual Activity  . Alcohol use: Never  . Drug use: Never  . Sexual activity: Yes    Birth control/protection: Condom  Other Topics Concern  . Not on file  Social History Narrative   Todd Stuart currently in 1st year of college ; He lives with his mother, step-father, and siblings.       He enjoys running, watch  TV, studying and working when feeling well.       Social Determinants of Health   Financial Resource Strain:   . Difficulty of Paying Living Expenses: Not on file  Food Insecurity:   . Worried About Programme researcher, broadcasting/film/video in the Last Year: Not on file  . Ran Out of Food in the Last Year: Not on file  Transportation Needs:   . Lack of Transportation (Medical): Not on file  . Lack of Transportation (Non-Medical): Not on file  Physical Activity:   . Days of Exercise per Week: Not on file  . Minutes of Exercise per Session: Not on file  Stress:   . Feeling of Stress : Not on file  Social Connections:   . Frequency of Communication with Friends and Family: Not on file  . Frequency of Social Gatherings with Friends and Family: Not on file  . Attends Religious Services: Not on  file  . Active Member of Clubs or Organizations: Not on file  . Attends Banker Meetings: Not on file  . Marital Status: Not on file  Intimate Partner Violence:   . Fear of Current or Ex-Partner: Not on file  . Emotionally Abused: Not on file  . Physically Abused: Not on file  . Sexually Abused: Not on file   Review of Systems Weight is stable    Objective:   Physical Exam Neurological:     Mental Status: He is alert.  Psychiatric:     Comments: Normal speech and interaction     Total duration of call 11 minutes       Assessment & Plan:

## 2020-08-24 ENCOUNTER — Encounter: Payer: Self-pay | Admitting: Internal Medicine

## 2020-08-24 NOTE — Telephone Encounter (Signed)
After several unsuccessful attempts to contact patient, I mailed a letter to patient requesting he call office and schedule 2 month follow up appointment with Dr.Letvak.

## 2020-09-13 ENCOUNTER — Other Ambulatory Visit: Payer: Self-pay | Admitting: Internal Medicine

## 2021-05-25 ENCOUNTER — Other Ambulatory Visit: Payer: Self-pay | Admitting: Internal Medicine

## 2021-05-27 ENCOUNTER — Other Ambulatory Visit: Payer: Self-pay | Admitting: Internal Medicine

## 2021-08-08 ENCOUNTER — Other Ambulatory Visit: Payer: Self-pay | Admitting: Physician Assistant

## 2021-08-08 DIAGNOSIS — R591 Generalized enlarged lymph nodes: Secondary | ICD-10-CM

## 2021-08-09 ENCOUNTER — Ambulatory Visit
Admission: RE | Admit: 2021-08-09 | Discharge: 2021-08-09 | Disposition: A | Discharge status: Home / Self Care | Payer: BC Managed Care – PPO | Source: Ambulatory Visit | Attending: Physician Assistant | Admitting: Physician Assistant

## 2021-08-09 ENCOUNTER — Other Ambulatory Visit: Payer: Self-pay

## 2021-08-09 DIAGNOSIS — R591 Generalized enlarged lymph nodes: Secondary | ICD-10-CM

## 2021-12-12 ENCOUNTER — Ambulatory Visit: Payer: Medicaid Other

## 2022-08-12 ENCOUNTER — Ambulatory Visit
Admission: EM | Admit: 2022-08-12 | Discharge: 2022-08-12 | Disposition: A | Discharge status: Home / Self Care | Payer: Medicaid Other | Attending: Family Medicine | Admitting: Family Medicine

## 2022-08-12 ENCOUNTER — Inpatient Hospital Stay
Admission: RE | Admit: 2022-08-12 | Discharge: 2022-08-12 | Disposition: A | Discharge status: Home / Self Care | Payer: Medicaid Other | Source: Ambulatory Visit

## 2022-08-12 ENCOUNTER — Encounter: Payer: Self-pay | Admitting: Emergency Medicine

## 2022-08-12 DIAGNOSIS — M5441 Lumbago with sciatica, right side: Secondary | ICD-10-CM | POA: Diagnosis not present

## 2022-08-12 MED ORDER — KETOROLAC TROMETHAMINE 30 MG/ML IJ SOLN
30.0000 mg | Freq: Once | INTRAMUSCULAR | Status: AC
  Administered 2022-08-12: 30 mg via INTRAMUSCULAR

## 2022-08-12 MED ORDER — TIZANIDINE HCL 2 MG PO TABS: 2.0000 mg | ORAL_TABLET | Freq: Every day | ORAL | 0 refills | Status: AC

## 2022-08-12 NOTE — ED Provider Notes (Signed)
Todd Stuart    CSN: [id number] Arrival date & time: 08/12/22  1525      History   Chief Complaint Chief Complaint  Patient presents with   Back Pain    Entered by patient    HPI Todd Stuart is a 21 y.o. male.   HPI Patient with a history of ADHD, anxiety, chronic musculoskeletal pain syndrome presents today mid lower back pain that is radiating down the right side. Patient presented to [facility] ER yesterday and left without being seen, however had labs completed. CBC/CMP were unremarkable. 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 He is experiencing pain with any positional changes. Taking OTC medications without any relief of pain symptoms.  Denies any changes in urination or bowel habits.  Patient has not had any fever.  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   Chronic pain syndrome    Connective tissue disease (HCC)    Depression    Disequilibrium    Headache    Hypertension    Hypertension    Panic attack    PTSD (post-traumatic stress disorder)     Patient Active Problem List   Diagnosis Date Noted   Other insomnia 04/07/2019   Generalized anxiety disorder 07/02/2018   PTSD (post-traumatic stress disorder) 07/02/2018   Attention deficit hyperactivity disorder (ADHD), predominantly inattentive type 07/02/2018   Low serum renin 04/29/2018   Abnormal endocrine laboratory test finding 10/13/2017   Chronic pain syndrome 10/07/2017   Cyst, arachnoid 09/18/2017   Dysautonomia (HCC) 09/15/2017   Panic attacks 09/09/2017   Hypertension 09/09/2017   Sleep disorder 07/16/2017   Connective tissue disorder (HCC) 07/16/2017   Anxiety state 07/16/2017   Migraine with aura and without status migrainosus, not intractable 03/30/2017   Chronic vertigo 03/30/2017    Tension-type headache, unspecified, not intractable 02/05/2017   Moderate persistent asthma without complication 10/24/2016   Amplified musculoskeletal pain syndrome 03/05/2016   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 Authorizing Provider  cloNIDine (CATAPRES) 0.1 MG tablet Take 1 tablet (0.1 mg total) by mouth at bedtime. 03/23/20  Yes Karie Schwalbe, MD  FLUoxetine (PROZAC) 20 MG tablet Take 1 tablet (20 mg total) by mouth daily. 08/16/20  Yes Karie Schwalbe, MD  Fluticasone-Salmeterol (ADVAIR) 250-50 MCG/DOSE AEPB Inhale 1 puff into the lungs in the morning and at bedtime. 05/22/20  Yes Tillman Abide I, MD  lisinopril (ZESTRIL) 2.5 MG tablet Take 1 tablet (2.5 mg total) by mouth daily. NEEDS OFFICE VISIT 05/27/21  Yes Karie Schwalbe, MD  tiZANidine (ZANAFLEX) 2 MG tablet Take 1 tablet (2 mg total) by mouth at bedtime. 08/12/22  Yes Bing Neighbors, FNP  traZODone (DESYREL) 100 MG tablet TAKE 2 TABLETS(200 MG) BY MOUTH AT BEDTIME 09/13/20  Yes Karie Schwalbe, MD  ondansetron (ZOFRAN) 4 MG tablet Take 1 tablet (4 mg total) by mouth every 8 (eight) hours as needed for nausea or  vomiting. 03/25/20   Karie Schwalbe, MD    Family History Family History  Problem Relation Age of Onset   Depression Mother    Depression Father    Anxiety disorder Father    Headache Sister    Seizures Neg Hx    Bipolar disorder Neg Hx    Schizophrenia Neg Hx    ADD / ADHD Neg Hx    Autism Neg Hx     Social History Social History   Tobacco Use   Smoking status: Never   Smokeless tobacco: Never  Vaping Use   Vaping Use: Never used   Substance Use Topics   Alcohol use: Never   Drug use: Never     Allergies   Indomethacin   Review of Systems Review of Systems Pertinent negatives listed in HPI   Physical Exam Triage Vital Signs ED Triage Vitals [08/12/22 1542]  Enc Vitals Group     BP 128/85     Pulse Rate 76     Resp 12     Temp 98.2 F (36.8 C)     Temp Source Oral     SpO2 97 %     Weight      Height      Head Circumference      Peak Flow      Pain Score 10     Pain Loc      Pain Edu?      Excl. in GC?    No data found.  Updated Vital Signs BP 128/85 (BP Location: Left Arm)   Pulse 76   Temp 98.2 F (36.8 C) (Oral)   Resp 12   SpO2 97%   Visual Acuity Right Eye Distance:   Left Eye Distance:   Bilateral Distance:    Right Eye Near:   Left Eye Near:    Bilateral Near:     Physical Exam Vitals reviewed.  Constitutional:      Appearance: Normal appearance.  HENT:     Head: Normocephalic and atraumatic.  Cardiovascular:     Rate and Rhythm: Normal rate and regular rhythm.  Pulmonary:     Effort: Pulmonary effort is normal.     Breath sounds: Normal breath sounds and air entry.  Musculoskeletal:     Cervical back: Normal.     Thoracic back: Normal.     Lumbar back: Bony tenderness present. No swelling, edema, deformity, signs of trauma or tenderness. Decreased range of motion. Positive right straight leg raise test. Negative left straight leg raise test.  Skin:    General: Skin is warm and dry.  Neurological:     Mental Status: He is alert.     GCS: GCS eye subscore is 4. GCS verbal subscore is 5. GCS motor subscore is 6.     Cranial Nerves: Cranial nerves 2-12 are intact.     Sensory: Sensation is intact.     Motor: Motor function is intact.     Coordination: Coordination is intact.     Gait: Gait is intact.      UC Treatments / Results  Labs (all labs ordered are listed, but only abnormal results are displayed) Labs Reviewed - No data to  display  EKG   Radiology No results found.  Procedures Procedures (including critical care time)  Medications Ordered in UC Medications  ketorolac (TORADOL) 30 MG/ML injection 30 mg (30 mg Intramuscular Given 08/12/22 1606)    Initial Impression / Assessment and Plan / UC Course  I have reviewed the triage vital signs and the nursing notes.  Pertinent labs & imaging results that were available during my care of the patient were reviewed by me and considered in my medical decision making (see chart for details).    Acute bilateral low back pain with right-sided sciatica Toradol IM given here in clinic today.  Discussed with patient to hold his clonidine if taking tizanidine for acute back pain to avoid becoming overly sedated as he has to take his trazodone for sleep.  Also encouraged to continue over-the-counter lidocaine patches along with Tylenol as needed for pain.  Advised to hold any NSAIDs until after midnight to avoid NSAID toxicity given recent Toradol.  Return if symptoms worsen or ER if symptoms become severe.  Patient verbalized understanding with plan.  He is ambulatory at discharge. Final Clinical Impressions(s) / UC Diagnoses   Final diagnoses:  Acute bilateral low back pain with right-sided sciatica     Discharge Instructions      Hold Clonidine when taking tizanidine muscle relaxer.  Hold off on taking any NSAIDS (Aspirin, Ibuprofen, Naproxen) for 8 hours or after 12 midnight due to being given Toradol. Apply heat to lower back in continuing the over-the-counter lidocaine patches as needed. Remember to stand and stretch frequently to prevent stiffening of your lower back which will only precipitated pain.  Advance activity back to normal as tolerated.     ED Prescriptions     Medication Sig Dispense Auth. Provider   tiZANidine (ZANAFLEX) 2 MG tablet Take 1 tablet (2 mg total) by mouth at bedtime. 12 tablet Bing Neighbors, FNP      PDMP not reviewed  this encounter.   Bing Neighbors, [place] 08/15/22 [phone]

## 2022-08-12 NOTE — Discharge Instructions (Addendum)
Hold Clonidine when taking tizanidine muscle relaxer.  Hold off on taking any NSAIDS (Aspirin, Ibuprofen, Naproxen) for 8 hours or after 12 midnight due to being given Toradol. Apply heat to lower back in continuing the over-the-counter lidocaine patches as needed. Remember to stand and stretch frequently to prevent stiffening of your lower back which will only precipitated pain.  Advance activity back to normal as tolerated.

## 2022-08-12 NOTE — ED Triage Notes (Signed)
Patient c/o mid lower back pain that started yesterday.   Patient denies bowel or bladder changes.   Patient endorses pain is radiating down RT leg.   Patient endorses reproducible pain that occurs when bending , ambulating, or getting dressed.   Patient has used Tylenol and Advil with no relief of symptoms.

## 2023-10-02 ENCOUNTER — Ambulatory Visit
Admission: EM | Admit: 2023-10-02 | Discharge: 2023-10-02 | Disposition: A | Discharge status: Home / Self Care | Payer: Medicaid Other

## 2023-10-02 DIAGNOSIS — R42 Dizziness and giddiness: Secondary | ICD-10-CM | POA: Diagnosis not present

## 2023-10-02 DIAGNOSIS — H938X3 Other specified disorders of ear, bilateral: Secondary | ICD-10-CM

## 2023-10-02 MED ORDER — MECLIZINE HCL 12.5 MG PO TABS: 12.5000 mg | ORAL_TABLET | Freq: Three times a day (TID) | ORAL | 0 refills | Status: AC | PRN

## 2023-10-02 MED ORDER — FLUTICASONE PROPIONATE 50 MCG/ACT NA SUSP: 1.0000 | Freq: Every day | NASAL | 0 refills | Status: AC

## 2023-10-02 NOTE — ED Triage Notes (Signed)
Patient to Urgent Care with complaints of bilateral ear pressure. Reports it is making him dizziness. Symptoms started last night. Reports certain head movements worsen symptoms.   Reports recent URI/ cough.

## 2023-10-02 NOTE — ED Provider Notes (Signed)
Todd Stuart    CSN: [id number] Arrival date & time: 10/02/23  1638      History   Chief Complaint Chief Complaint  Patient presents with   Otalgia    HPI Todd Stuart is a 22 y.o. male.   22 year old male pt, Todd Stuart, presents to urgent care for relation of bilateral ear pressure.  Patient states it makes him dizzy symptoms started last night.  Patient reports certain movements worsen symptoms.  Patient reports recent URI cough.  Patient has a past medical have history of arachnoid cyst recently followed up by neurology who cleared patient at present.  The history is provided by the patient. No language interpreter was used.    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   Chronic pain syndrome    Connective tissue disease (HCC)    Depression    Disequilibrium    Headache    Hypertension    Hypertension    Panic attack    PTSD (post-traumatic stress disorder)     Patient Active Problem List   Diagnosis Date Noted   Ear pressure, bilateral 10/02/2023   Other insomnia 04/07/2019   Generalized anxiety disorder 07/02/2018   PTSD (post-traumatic stress disorder) 07/02/2018   Attention deficit hyperactivity disorder (ADHD), predominantly inattentive type 07/02/2018   Low serum renin 04/29/2018   Abnormal endocrine laboratory test finding 10/13/2017   Chronic pain syndrome 10/07/2017   Cyst, arachnoid 09/18/2017   Dysautonomia (HCC) 09/15/2017   Panic attacks 09/09/2017   Hypertension 09/09/2017   Sleep disorder 07/16/2017   Connective tissue disorder (HCC) 07/16/2017   Anxiety state 07/16/2017   Migraine with aura and without status migrainosus, not intractable 03/30/2017   Vertigo 03/30/2017   Tension-type headache, unspecified, not intractable 02/05/2017   Moderate persistent asthma  without complication 10/24/2016   Amplified musculoskeletal pain syndrome 03/05/2016   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 Authorizing Provider  fluticasone (FLONASE) 50 MCG/ACT nasal spray Place 1 spray into both nostrils daily. 10/02/23  Yes Todd Stuart, Todd March, NP  lisdexamfetamine (VYVANSE) 30 MG capsule Take 1 capsule by mouth every morning. 07/21/23  Yes [provider]  meclizine (ANTIVERT) 12.5 MG tablet Take 1 tablet (12.5 mg total) by mouth 3 (three) times daily as needed for up to 5 days for dizziness. 10/02/23 10/07/23 Yes Todd Stuart, Todd March, NP  cloNIDine (CATAPRES) 0.1 MG tablet Take 1 tablet (0.1 mg total) by mouth at bedtime. 03/23/20   Karie Schwalbe, MD  FLUoxetine (PROZAC) 20 MG tablet Take 1 tablet (20 mg total) by mouth daily. Patient not taking: Reported on 10/02/2023 08/16/20   Karie Schwalbe, MD  Fluticasone-Salmeterol (ADVAIR) 250-50 MCG/DOSE AEPB Inhale 1 puff into the lungs in the morning and at bedtime. Patient not taking: Reported on 10/02/2023 05/22/20   Tillman Abide I, MD  lisinopril (ZESTRIL) 2.5 MG tablet Take 1 tablet (2.5 mg total) by mouth daily. NEEDS OFFICE VISIT Patient not taking: Reported  on 10/02/2023 05/27/21   Karie Schwalbe, MD  ondansetron (ZOFRAN) 4 MG tablet Take 1 tablet (4 mg total) by mouth every 8 (eight) hours as needed for nausea or vomiting. 03/25/20   Karie Schwalbe, MD  tiZANidine (ZANAFLEX) 2 MG tablet Take 1 tablet (2 mg total) by mouth at bedtime. Patient not taking: Reported on 10/02/2023 08/12/22   Bing Neighbors, NP  traZODone (DESYREL) 100 MG tablet TAKE 2  TABLETS(200 MG) BY MOUTH AT BEDTIME 09/13/20   Karie Schwalbe, MD    Family History Family History  Problem Relation Age of Onset   Depression Mother    Depression Father    Anxiety disorder Father    Headache Sister    Seizures Neg Hx    Bipolar disorder Neg Hx    Schizophrenia Neg Hx    ADD / ADHD Neg Hx    Autism Neg Hx     Social History Social History   Tobacco Use   Smoking status: Never   Smokeless tobacco: Never  Vaping Use   Vaping status: Never Used  Substance Use Topics   Alcohol use: Never   Drug use: Never     Allergies   Indomethacin   Review of Systems Review of Systems  Constitutional:  Negative for fever.  HENT:         Bilateral ear pressure  Neurological:  Positive for dizziness.  All other systems reviewed and are negative.    Physical Exam Triage Vital Signs ED Triage Vitals [10/02/23 1645]  Encounter Vitals Group     BP 131/86     Systolic BP Percentile      Diastolic BP Percentile      Pulse Rate (!) 101     Resp 18     Temp 97.9 F (36.6 C)     Temp src      SpO2 98 %     Weight      Height      Head Circumference      Peak Flow      Pain Score      Pain Loc      Pain Education      Exclude from Growth Chart    No data found.  Updated Vital Signs BP 131/86   Pulse (!) 101   Temp 97.9 F (36.6 C)   Resp 18   SpO2 98%   Visual Acuity Right Eye Distance:   Left Eye Distance:   Bilateral Distance:    Right Eye Near:   Left Eye Near:    Bilateral Near:     Physical Exam Vitals and nursing note reviewed.  Constitutional:      General: He is not in acute distress.    Appearance: He is well-developed and well-groomed.  HENT:     Head: Normocephalic and atraumatic.  Eyes:     Conjunctiva/sclera: Conjunctivae normal.  Cardiovascular:     Rate and Rhythm: Normal rate and regular rhythm.     Pulses: Normal pulses.     Heart sounds: Normal heart sounds. No murmur heard. Pulmonary:     Effort: Pulmonary  effort is normal. No respiratory distress.     Breath sounds: Normal breath sounds and air entry.  Abdominal:     Palpations: Abdomen is soft.     Tenderness: There is no abdominal tenderness.  Musculoskeletal:        General: No swelling.     Cervical back: Neck supple.  Skin:  General: Skin is warm and dry.     Capillary Refill: Capillary refill takes less than 2 seconds.  Neurological:     General: No focal deficit present.     Mental Status: He is alert and oriented to person, place, and time.     GCS: GCS eye subscore is 4. GCS verbal subscore is 5. GCS motor subscore is 6.     Cranial Nerves: No cranial nerve deficit.     Sensory: No sensory deficit.  Psychiatric:        Attention and Perception: Attention normal.        Mood and Affect: Mood normal.        Speech: Speech normal.        Behavior: Behavior normal. Behavior is cooperative.      UC Treatments / Results  Labs (all labs ordered are listed, but only abnormal results are displayed) Labs Reviewed - No data to display  EKG   Radiology No results found.  Procedures Procedures (including critical care time)  Medications Ordered in UC Medications - No data to display  Initial Impression / Assessment and Plan / UC Course  I have reviewed the triage vital signs and the nursing notes.  Pertinent labs & imaging results that were available during my care of the patient were reviewed by me and considered in my medical decision making (see chart for details).     Ddx: Vertigo, bilateral ear pressure, viral illness, allergies Final Clinical Impressions(s) / UC Diagnoses   Final diagnoses:  Ear pressure, bilateral  Vertigo     Discharge Instructions      Rest,push fluids. Take flonase as prescribed as well as meclizine. If your dizziness worsens or you develop new or worsening issues(Headache,nausea,vomiting,etc) go to ER for further evaluation.      ED Prescriptions     Medication Sig Dispense  Auth. Provider   fluticasone (FLONASE) 50 MCG/ACT nasal spray Place 1 spray into both nostrils daily. 16 mL Todd Montesinos, NP   meclizine (ANTIVERT) 12.5 MG tablet Take 1 tablet (12.5 mg total) by mouth 3 (three) times daily as needed for up to 5 days for dizziness. 15 tablet Todd Stuart, Todd March, NP      PDMP not reviewed this encounter.   Todd Gourd, NP 10/02/23 [phone]

## 2023-10-02 NOTE — Discharge Instructions (Signed)
Rest,push fluids. Take flonase as prescribed as well as meclizine. If your dizziness worsens or you develop new or worsening issues(Headache,nausea,vomiting,etc) go to ER for further evaluation.

## 2024-04-10 ENCOUNTER — Emergency Department (HOSPITAL_BASED_OUTPATIENT_CLINIC_OR_DEPARTMENT_OTHER): Admission: EM | Admit: 2024-04-10 | Discharge: 2024-04-10 | Disposition: A | Discharge status: Home / Self Care

## 2024-04-10 ENCOUNTER — Encounter (HOSPITAL_BASED_OUTPATIENT_CLINIC_OR_DEPARTMENT_OTHER): Payer: Self-pay

## 2024-04-10 ENCOUNTER — Other Ambulatory Visit: Payer: Self-pay

## 2024-04-10 ENCOUNTER — Emergency Department (HOSPITAL_BASED_OUTPATIENT_CLINIC_OR_DEPARTMENT_OTHER)

## 2024-04-10 DIAGNOSIS — K5900 Constipation, unspecified: Secondary | ICD-10-CM | POA: Insufficient documentation

## 2024-04-10 LAB — CBC WITH DIFFERENTIAL/PLATELET
Abs Immature Granulocytes: 0.01 10*3/uL (ref 0.00–0.07)
Basophils Absolute: 0 10*3/uL (ref 0.0–0.1)
Basophils Relative: 1 %
Eosinophils Absolute: 0.2 10*3/uL (ref 0.0–0.5)
Eosinophils Relative: 3 %
HCT: 42.3 % (ref 39.0–52.0)
Hemoglobin: 14 g/dL (ref 13.0–17.0)
Immature Granulocytes: 0 %
Lymphocytes Relative: 32 %
Lymphs Abs: 1.8 10*3/uL (ref 0.7–4.0)
MCH: 26.5 pg (ref 26.0–34.0)
MCHC: 33.1 g/dL (ref 30.0–36.0)
MCV: 80.1 fL (ref 80.0–100.0)
Monocytes Absolute: 0.5 10*3/uL (ref 0.1–1.0)
Monocytes Relative: 9 %
Neutro Abs: 3.1 10*3/uL (ref 1.7–7.7)
Neutrophils Relative %: 55 %
Platelets: 254 10*3/uL (ref 150–400)
RBC: 5.28 MIL/uL (ref 4.22–5.81)
RDW: 12.9 % (ref 11.5–15.5)
WBC: 5.5 10*3/uL (ref 4.0–10.5)
nRBC: 0 % (ref 0.0–0.2)

## 2024-04-10 LAB — COMPREHENSIVE METABOLIC PANEL WITH GFR
ALT: 13 U/L (ref 0–44)
AST: 22 U/L (ref 15–41)
Albumin: 4.8 g/dL (ref 3.5–5.0)
Alkaline Phosphatase: 77 U/L (ref 38–126)
Anion gap: 12 (ref 5–15)
BUN: 18 mg/dL (ref 6–20)
CO2: 23 mmol/L (ref 22–32)
Calcium: 9.7 mg/dL (ref 8.9–10.3)
Chloride: 104 mmol/L (ref 98–111)
Creatinine, Ser: 1.04 mg/dL (ref 0.61–1.24)
GFR, Estimated: >60 mL/min (ref >60)
Glucose, Bld: 85 mg/dL (ref 70–99)
Potassium: 3.5 mmol/L (ref 3.5–5.1)
Sodium: 139 mmol/L (ref 135–145)
Total Bilirubin: 0.5 mg/dL (ref 0.0–1.2)
Total Protein: 6.5 g/dL (ref 6.5–8.1)

## 2024-04-10 MED ORDER — IOHEXOL 300 MG/ML  SOLN
100.0000 mL | Freq: Once | INTRAMUSCULAR | Status: AC | PRN
  Administered 2024-04-10: 100 mL via INTRAVENOUS

## 2024-04-10 NOTE — ED Triage Notes (Deleted)
 Todd Stuart

## 2024-04-10 NOTE — ED Notes (Addendum)
 Todd Stuart

## 2024-04-10 NOTE — ED Triage Notes (Signed)
 Pt states that he has rectal pressure that began after attempting to relieve constipation with an enema earlier today. Pt reports that last BM was 3 days ago. Denies bleeding/hemorrhoid.

## 2024-04-10 NOTE — ED Provider Notes (Signed)
 Todd Stuart   CSN: [id number] Arrival date & time: 04/10/24  2024     History  Chief Complaint  Patient presents with   Constipation    Todd Stuart is a 23 y.o. male presents today for rectal pressure that began after attempting to relieve constipation with an enema he inserted into rectum his earlier today.  Patient states that his last normal bowel movement was 2 weeks ago.  Patient denies bleeding/hemorrhoid, pain, fever, abdominal pain, or any other complaints at this time.  Patient reports trying saline enema, Colace, and MiraLAX without bowel movement   Constipation Associated symptoms: abdominal pain        Home Medications Prior to Admission medications   Medication Sig Start Date End Date Taking? Authorizing Provider  cloNIDine  (CATAPRES ) 0.1 MG tablet Take 1 tablet (0.1 mg total) by mouth at bedtime. 03/23/20   Letvak, Richard I, MD  FLUoxetine  (PROZAC ) 20 MG tablet Take 1 tablet (20 mg total) by mouth daily. Patient not taking: Reported on 10/02/2023 08/16/20   Helaine Llanos, MD  fluticasone  (FLONASE ) 50 MCG/ACT nasal spray Place 1 spray into both nostrils daily. 10/02/23   Defelice, Jeanette, NP  Fluticasone -Salmeterol (ADVAIR) 250-50 MCG/DOSE AEPB Inhale 1 puff into the lungs in the morning and at bedtime. Patient not taking: Reported on 10/02/2023 05/22/20   Letvak, Richard I, MD  lisdexamfetamine (VYVANSE ) 30 MG capsule Take 1 capsule by mouth every morning. 07/21/23   [provider]  lisinopril  (ZESTRIL ) 2.5 MG tablet Take 1 tablet (2.5 mg total) by mouth daily. NEEDS OFFICE VISIT Patient not taking: Reported on 10/02/2023 05/27/21   Helaine Llanos, MD  ondansetron  (ZOFRAN ) 4 MG tablet Take 1 tablet (4 mg total) by mouth every 8 (eight) hours as needed for nausea or vomiting. 03/25/20   Helaine Llanos, MD  tiZANidine  (ZANAFLEX ) 2 MG tablet Take 1 tablet (2 mg total) by mouth at  bedtime. Patient not taking: Reported on 10/02/2023 08/12/22   Buena Carmine, NP  traZODone  (DESYREL ) 100 MG tablet TAKE 2 TABLETS(200 MG) BY MOUTH AT BEDTIME 09/13/20   Helaine Llanos, MD      Allergies    Indomethacin    Review of Systems   Review of Systems  Gastrointestinal:  Positive for abdominal pain and constipation.    Physical Exam Updated Vital Signs BP (!) 135/91   Pulse 66   Temp 97.7 F (36.5 C) (Oral)   Resp 16   Ht 6\' 1"  (1.854 m)   Wt 75.8 kg   SpO2 100%   BMI 22.03 kg/m  Physical Exam Vitals and nursing Stuart reviewed. Exam conducted with a chaperone present.  Constitutional:      General: He is not in acute distress.    Appearance: Normal appearance. He is well-developed. He is not ill-appearing.  HENT:     Head: Normocephalic and atraumatic.  Eyes:     Conjunctiva/sclera: Conjunctivae normal.  Cardiovascular:     Rate and Rhythm: Normal rate and regular rhythm.     Heart sounds: No murmur heard. Pulmonary:     Effort: Pulmonary effort is normal. No respiratory distress.     Breath sounds: Normal breath sounds.  Abdominal:     General: There is no distension.     Palpations: Abdomen is soft.     Tenderness: There is abdominal tenderness in the suprapubic area.  Genitourinary:    Rectum: Tenderness present. No anal fissure, external  hemorrhoid or internal hemorrhoid. Normal anal tone.  Musculoskeletal:        General: No swelling.     Cervical back: Neck supple.  Skin:    General: Skin is warm and dry.     Capillary Refill: Capillary refill takes less than 2 seconds.  Neurological:     Mental Status: He is alert.  Psychiatric:        Mood and Affect: Mood normal.     ED Results / Procedures / Treatments   Labs (all labs ordered are listed, but only abnormal results are displayed) Labs Reviewed  COMPREHENSIVE METABOLIC PANEL WITH GFR  CBC WITH DIFFERENTIAL/PLATELET    EKG None  Radiology CT ABDOMEN PELVIS W CONTRAST Result  Date: 04/10/2024 EXAM: CT ABDOMEN AND PELVIS WITH CONTRAST 04/10/2024 10:01:01 PM TECHNIQUE: CT of the abdomen and pelvis was performed with 100mL of iohexol (OMNIPAQUE) 300 MG/ML solution. Multiplanar reformatted images are provided for review. Automated exposure control, iterative reconstruction, and/or weight based adjustment of the mA/kV was utilized to reduce the radiation dose to as low as reasonably achievable. COMPARISON: None available. CLINICAL HISTORY: Rectal pain after enema. FINDINGS: LOWER CHEST: No acute abnormality. HEPATOBILIARY: The liver is unremarkable. Gallbladder is unremarkable. No biliary ductal dilatation. SPLEEN: No acute abnormality. PANCREAS: No acute abnormality. ADRENAL GLANDS: No acute abnormality. KIDNEYS, URETERS: No stones in the kidneys or ureters. No evidence of hydronephrosis. No evidence of perinephric or periureteral stranding. GI AND BOWEL: Stomach demonstrates no acute abnormality. There is no evidence of bowel obstruction. Normal appendix (image 63). No rectal wall thickening or inflammatory changes. PELVIS: Urinary bladder is unremarkable. PERITONEUM AND RETROPERITONEUM: No evidence of free fluid or free air. LYMPH NODES: No evidence of lymphadenopathy. BONES AND SOFT TISSUES: No acute osseous abnormality. No focal soft tissue abnormality. Incidental adrenal and/or renal findings do not require follow up imaging. IMPRESSION: 1. No acute findings. Electronically signed by: Zadie Herter MD 04/10/2024 10:08 PM EDT RP Workstation: [id number]    Procedures Procedures    Medications Ordered in ED Medications  iohexol (OMNIPAQUE) 300 MG/ML solution 100 mL (100 mLs Intravenous Contrast Given 04/10/24 2152)    ED Course/ Medical Decision Making/ A&P                                 Medical Decision Making  This patient presents to the ED for concern of constipation and rectal pressure differential diagnosis includes bowel perforation, constipation, hemorrhoid,  anal fissure, SBO   Lab Tests:  I Ordered, and personally interpreted labs.  The pertinent results include: CBC and CMP WNL   Imaging Studies ordered:  I ordered imaging studies including CT abdomen pelvis with contrast I independently visualized and interpreted imaging which showed no acute findings I agree with the radiologist interpretation  Considered for admission or further workup however patient's vital signs, physical exam, labs, and imaging been reassuring.  Patient's symptoms likely due to constipation.  Patient advised to take MiraLAX and follow-up with his primary care if symptoms persist for further evaluation workup.  I feel patient is safe for discharge at this time        Final Clinical Impression(s) / ED Diagnoses Final diagnoses:  Constipation, unspecified constipation type    Rx / DC Orders ED Discharge Orders     None         Carie Charity, PA-C 04/10/24 2259    Rolinda Climes, DO 04/10/24 2314

## 2024-04-10 NOTE — Discharge Instructions (Addendum)
 Today you were seen for rectal pain and constipation.  Please take 1 capful of MiraLAX twice daily until you are having soft serve consistency bowel movements, then reduce to once daily.  Please follow-up with your primary care for further evaluation if your symptoms persist.  Thank you for letting us  treat you today. After reviewing your labs and imaging, I feel you are safe to go home. Please follow up with your PCP in the next several days and provide them with your records from this visit. Return to the Emergency Room if pain becomes severe or symptoms worsen.

## 2024-04-28 ENCOUNTER — Ambulatory Visit
Admission: EM | Admit: 2024-04-28 | Discharge: 2024-04-28 | Disposition: A | Discharge status: Home / Self Care | Attending: Emergency Medicine | Admitting: Emergency Medicine

## 2024-04-28 ENCOUNTER — Encounter: Payer: Self-pay | Admitting: *Deleted

## 2024-04-28 DIAGNOSIS — H6502 Acute serous otitis media, left ear: Secondary | ICD-10-CM | POA: Diagnosis not present

## 2024-04-28 MED ORDER — IPRATROPIUM BROMIDE 0.03 % NA SOLN: 2.0000 | Freq: Two times a day (BID) | NASAL | 0 refills | Status: AC

## 2024-04-28 MED ORDER — AMOXICILLIN 500 MG PO CAPS: 500.0000 mg | ORAL_CAPSULE | Freq: Two times a day (BID) | ORAL | 0 refills | Status: AC

## 2024-04-28 NOTE — ED Triage Notes (Signed)
 Patient states 1 week of right ear pain/fullness, no fever

## 2024-04-28 NOTE — Discharge Instructions (Signed)
 Today you are being treated for an infection of the eardrum, it also appears to be fluid behind the ear which is treated by using medicine for congestion  Take amoxicillin twice daily for 7 days, you should begin to see improvement after 48 hours of medication use and then it should progressively get better  May use nasal spray every morning and every evening to clear out the sinuses  You may use Tylenol  or ibuprofen for management of discomfort  May hold warm compresses to the ear for additional comfort  Please not attempted any ear cleaning or object or fluid placement into the ear canal to prevent further irritation

## 2024-04-28 NOTE — ED Provider Notes (Signed)
 Todd Stuart    CSN: [id number] Arrival date & time: 04/28/24  1356      History   Chief Complaint Chief Complaint  Patient presents with   Otalgia    HPI Todd Stuart is a 23 y.o. male.   Presents for evaluation of left-sided ear pain and fullness beginning 7 days ago.  Endorses a echoing sound and popping sensation occurring intermittently.  Has attempted use of peroxide which was ineffective.  Denies fever, congestion, ear drainage or decreased hearing.  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   Chronic pain syndrome    Connective tissue disease (HCC)    Depression    Disequilibrium    Headache    Hypertension    Hypertension    Panic attack    PTSD (post-traumatic stress disorder)     Patient Active Problem List   Diagnosis Date Noted   Ear pressure, bilateral 10/02/2023   Other insomnia 04/07/2019   Generalized anxiety disorder 07/02/2018   PTSD (post-traumatic stress disorder) 07/02/2018   Attention deficit hyperactivity disorder (ADHD), predominantly inattentive type 07/02/2018   Low serum renin 04/29/2018   Abnormal endocrine laboratory test finding 10/13/2017   Chronic pain syndrome 10/07/2017   Cyst, arachnoid 09/18/2017   Dysautonomia (HCC) 09/15/2017   Panic attacks 09/09/2017   Hypertension 09/09/2017   Sleep disorder 07/16/2017   Connective tissue disorder (HCC) 07/16/2017   Anxiety state 07/16/2017   Migraine with aura and without status migrainosus, not intractable 03/30/2017   Vertigo 03/30/2017   Tension-type headache, unspecified, not intractable 02/05/2017   Moderate persistent asthma without complication 10/24/2016   Amplified musculoskeletal pain syndrome 03/05/2016   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 Authorizing Provider  amoxicillin (AMOXIL) 500 MG capsule Take 1 capsule (500 mg total) by mouth 2 (two) times daily for 7 days. 04/28/24 05/05/24 Yes Daekwon Beswick, Maybelle Spatz, NP  Fluticasone -Salmeterol (ADVAIR) 250-50 MCG/DOSE AEPB Inhale 1 puff into the lungs in the morning and at bedtime. 05/22/20  Yes Letvak, Richard I, MD  ipratropium (ATROVENT) 0.03 % nasal spray Place 2 sprays into both nostrils every 12 (twelve) hours. 04/28/24  Yes Alexandru Moorer R, NP  cloNIDine  (CATAPRES ) 0.1 MG tablet Take 1 tablet (0.1 mg total) by mouth at bedtime. 03/23/20   Helaine Llanos, MD  FLUoxetine  (PROZAC ) 20 MG tablet Take 1 tablet (20 mg total) by mouth daily. Patient not taking: Reported on 10/02/2023 08/16/20   Helaine Llanos, MD  fluticasone  (FLONASE ) 50 MCG/ACT nasal spray Place 1 spray into both nostrils daily. 10/02/23   Defelice, Jeanette, NP  lisdexamfetamine (VYVANSE ) 30 MG capsule Take 1 capsule by mouth every morning. 07/21/23   [provider]  lisinopril  (ZESTRIL ) 2.5 MG tablet Take 1 tablet (2.5 mg total) by mouth daily. NEEDS OFFICE VISIT Patient not taking: Reported on 10/02/2023 05/27/21   Helaine Llanos, MD  ondansetron  (ZOFRAN ) 4 MG tablet Take 1 tablet (4 mg total) by mouth  every 8 (eight) hours as needed for nausea or vomiting. 03/25/20   Helaine Llanos, MD  tiZANidine  (ZANAFLEX ) 2 MG tablet Take 1 tablet (2 mg total) by mouth at bedtime. Patient not taking: Reported on 10/02/2023 08/12/22   Buena Carmine, NP  traZODone  (DESYREL ) 100 MG tablet TAKE 2 TABLETS(200 MG) BY MOUTH AT BEDTIME 09/13/20   Helaine Llanos, MD    Family History Family History  Problem Relation Age of Onset    Depression Mother    Depression Father    Anxiety disorder Father    Headache Sister    Seizures Neg Hx    Bipolar disorder Neg Hx    Schizophrenia Neg Hx    ADD / ADHD Neg Hx    Autism Neg Hx     Social History Social History   Tobacco Use   Smoking status: Never   Smokeless tobacco: Never  Vaping Use   Vaping status: Never Used  Substance Use Topics   Alcohol use: Never   Drug use: Never     Allergies   Indomethacin   Review of Systems Review of Systems   Physical Exam Triage Vital Signs ED Triage Vitals  Encounter Vitals Group     BP 04/28/24 1419 119/83     Systolic BP Percentile --      Diastolic BP Percentile --      Pulse Rate 04/28/24 1419 90     Resp 04/28/24 1419 16     Temp 04/28/24 1419 98.6 F (37 C)     Temp Source 04/28/24 1419 Oral     SpO2 04/28/24 1419 98 %     Weight 04/28/24 1417 157 lb (71.2 kg)     Height 04/28/24 1417 6' (1.829 m)     Head Circumference --      Peak Flow --      Pain Score 04/28/24 1417 5     Pain Loc --      Pain Education --      Exclude from Growth Chart --    No data found.  Updated Vital Signs BP 119/83 (BP Location: Left Arm)   Pulse 90   Temp 98.6 F (37 C) (Oral)   Resp 16   Ht 6' (1.829 m)   Wt 157 lb (71.2 kg)   SpO2 98%   BMI 21.29 kg/m   Visual Acuity Right Eye Distance:   Left Eye Distance:   Bilateral Distance:    Right Eye Near:   Left Eye Near:    Bilateral Near:     Physical Exam Constitutional:      Appearance: Normal appearance.  HENT:     Right Ear: Ear canal and external ear normal. A middle ear effusion is present.     Ears:     Comments: Erythema affecting approximately 50% of the left tympanic membrane, no abnormality to the canal or external ear Pulmonary:     Effort: Pulmonary effort is normal.  Neurological:     Mental Status: He is alert and oriented to person, place, and time. Mental status is at baseline.      UC Treatments / Results  Labs (all labs  ordered are listed, but only abnormal results are displayed) Labs Reviewed - No data to display  EKG   Radiology No results found.  Procedures Procedures (including critical care time)  Medications Ordered in UC Medications - No data to display  Initial Impression / Assessment and Plan / UC Course  I have reviewed the triage vital signs and the nursing notes.  Pertinent labs & imaging results that were available during my care of the patient were reviewed by me and considered in my medical decision making (see chart for details).  Nonrecurrent acute serous otitis media of left ear  Erythema to the tympanic membrane is consistent with infection,prescribed amoxicillin and Atrovent advised against ear cleaning, may use over-the-counter analgesics and warm compresses to the external ear for comfort, may follow-up if symptoms persist worsen or recur  Final Clinical Impressions(s) / UC Diagnoses   Final diagnoses:  Non-recurrent acute serous otitis media of left ear   Discharge Instructions      Today you are being treated for an infection of the eardrum, it also appears to be fluid behind the ear which is treated by using medicine for congestion  Take amoxicillin twice daily for 7 days, you should begin to see improvement after 48 hours of medication use and then it should progressively get better  May use nasal spray every morning and every evening to clear out the sinuses  You may use Tylenol  or ibuprofen for management of discomfort  May hold warm compresses to the ear for additional comfort  Please not attempted any ear cleaning or object or fluid placement into the ear canal to prevent further irritation   ED Prescriptions     Medication Sig Dispense Auth. Provider   ipratropium (ATROVENT) 0.03 % nasal spray Place 2 sprays into both nostrils every 12 (twelve) hours. 30 mL Neha Waight R, NP   amoxicillin (AMOXIL) 500 MG capsule Take 1 capsule (500 mg total) by  mouth 2 (two) times daily for 7 days. 14 capsule Matisse Salais, Maybelle Spatz, NP      PDMP not reviewed this encounter.   Reena Canning, NP 04/28/24 1551

## 2024-05-09 ENCOUNTER — Ambulatory Visit: Admission: EM | Admit: 2024-05-09 | Discharge: 2024-05-09 | Disposition: A | Discharge status: Home / Self Care

## 2024-05-09 ENCOUNTER — Encounter: Payer: Self-pay | Admitting: Emergency Medicine

## 2024-05-09 ENCOUNTER — Other Ambulatory Visit: Payer: Self-pay

## 2024-05-09 DIAGNOSIS — H9201 Otalgia, right ear: Secondary | ICD-10-CM

## 2024-05-09 NOTE — ED Provider Notes (Signed)
 Todd Stuart    CSN: [id number] Arrival date & time: 05/09/24  7829      History   Chief Complaint Chief Complaint  Patient presents with   Otalgia    HPI Todd Stuart is a 23 y.o. male.  Patient presents with ongoing right ear pain as well as bodyaches and sore throat.  He reports temp of 100 yesterday.  No OTC medication taken today.  No ear drainage, difficulty swallowing, cough, shortness of breath.  Patient was seen at this urgent care on 04/28/2024; diagnosed with left otitis media; treated with amoxicillin  and ipratropium nasal spray.  The history is provided by the patient and medical records.    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   Chronic pain syndrome    Connective tissue disease (HCC)    Depression    Disequilibrium    Headache    Hypertension    Hypertension    Panic attack    PTSD (post-traumatic stress disorder)     Patient Active Problem List   Diagnosis Date Noted   Ear pressure, bilateral 10/02/2023   Other insomnia 04/07/2019   Generalized anxiety disorder 07/02/2018   PTSD (post-traumatic stress disorder) 07/02/2018   Attention deficit hyperactivity disorder (ADHD), predominantly inattentive type 07/02/2018   Low serum renin 04/29/2018   Abnormal endocrine laboratory test finding 10/13/2017   Chronic pain syndrome 10/07/2017   Cyst, arachnoid 09/18/2017   Dysautonomia (HCC) 09/15/2017   Panic attacks 09/09/2017   Hypertension 09/09/2017   Sleep disorder 07/16/2017   Connective tissue disorder (HCC) 07/16/2017   Anxiety state 07/16/2017   Migraine with aura and without status migrainosus, not intractable 03/30/2017   Vertigo 03/30/2017   Tension-type headache, unspecified, not intractable 02/05/2017   Moderate persistent asthma without complication 10/24/2016    Amplified musculoskeletal pain syndrome 03/05/2016   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 Authorizing Provider  cloNIDine  (CATAPRES ) 0.1 MG tablet Take 1 tablet (0.1 mg total) by mouth at bedtime. 03/23/20   Helaine Llanos, MD  FLUoxetine  (PROZAC ) 20 MG tablet Take 1 tablet (20 mg total) by mouth daily. Patient not taking: Reported on 10/02/2023 08/16/20   Helaine Llanos, MD  fluticasone  (FLONASE ) 50 MCG/ACT nasal spray Place 1 spray into both nostrils daily. 10/02/23   Defelice, Jeanette, NP  Fluticasone -Salmeterol (ADVAIR) 250-50 MCG/DOSE AEPB Inhale 1 puff into the lungs in the morning and at bedtime. 05/22/20   Letvak, Richard I, MD  ipratropium (ATROVENT ) 0.03 % nasal spray Place 2 sprays into both nostrils every 12 (twelve) hours. 04/28/24   White, Maybelle Spatz, NP  lisdexamfetamine (VYVANSE ) 30 MG capsule Take 1 capsule by mouth every morning. 07/21/23   [provider]  lisinopril  (ZESTRIL ) 2.5 MG tablet Take 1 tablet (2.5 mg total) by mouth daily. NEEDS OFFICE VISIT Patient not taking: Reported on 10/02/2023 05/27/21   Helaine Llanos, MD  ondansetron  (ZOFRAN ) 4 MG tablet Take 1 tablet (4 mg total)  by mouth every 8 (eight) hours as needed for nausea or vomiting. 03/25/20   Helaine Llanos, MD  tiZANidine  (ZANAFLEX ) 2 MG tablet Take 1 tablet (2 mg total) by mouth at bedtime. Patient not taking: Reported on 10/02/2023 08/12/22   Buena Carmine, NP  traZODone  (DESYREL ) 100 MG tablet TAKE 2 TABLETS(200 MG) BY MOUTH AT BEDTIME 09/13/20   Helaine Llanos, MD    Family History Family History  Problem Relation Age of Onset    Depression Mother    Depression Father    Anxiety disorder Father    Headache Sister    Seizures Neg Hx    Bipolar disorder Neg Hx    Schizophrenia Neg Hx    ADD / ADHD Neg Hx    Autism Neg Hx     Social History Social History   Tobacco Use   Smoking status: Never   Smokeless tobacco: Never  Vaping Use   Vaping status: Never Used  Substance Use Topics   Alcohol use: Never   Drug use: Never     Allergies   Indomethacin and Doxycycline   Review of Systems Review of Systems  Constitutional:  Positive for fever. Negative for chills.  HENT:  Positive for ear pain and sore throat. Negative for ear discharge and trouble swallowing.   Respiratory:  Negative for cough and shortness of breath.      Physical Exam Triage Vital Signs ED Triage Vitals  Encounter Vitals Group     BP 05/09/24 0942 122/77     Systolic BP Percentile --      Diastolic BP Percentile --      Pulse Rate 05/09/24 0942 64     Resp 05/09/24 0942 18     Temp 05/09/24 0942 98.1 F (36.7 C)     Temp src --      SpO2 05/09/24 0942 98 %     Weight --      Height --      Head Circumference --      Peak Flow --      Pain Score 05/09/24 0958 8     Pain Loc --      Pain Education --      Exclude from Growth Chart --    No data found.  Updated Vital Signs BP 122/77   Pulse 64   Temp 98.1 F (36.7 C)   Resp 18   SpO2 98%   Visual Acuity Right Eye Distance:   Left Eye Distance:   Bilateral Distance:    Right Eye Near:   Left Eye Near:    Bilateral Near:     Physical Exam Constitutional:      General: He is not in acute distress. HENT:     Right Ear: Tympanic membrane and ear canal normal.     Left Ear: Tympanic membrane and ear canal normal.     Nose: Nose normal.     Mouth/Throat:     Mouth: Mucous membranes are moist.     Pharynx: Oropharynx is clear.     Comments: Clear PND Cardiovascular:     Rate and Rhythm: Normal rate and regular rhythm.     Heart sounds: Normal heart  sounds.  Pulmonary:     Effort: Pulmonary effort is normal. No respiratory distress.     Breath sounds: Normal breath sounds.  Neurological:     Mental Status: He is alert.      UC Treatments / Results  Labs (all  labs ordered are listed, but only abnormal results are displayed) Labs Reviewed - No data to display  EKG   Radiology No results found.  Procedures Procedures (including critical care time)  Medications Ordered in UC Medications - No data to display  Initial Impression / Assessment and Plan / UC Course  I have reviewed the triage vital signs and the nursing notes.  Pertinent labs & imaging results that were available during my care of the patient were reviewed by me and considered in my medical decision making (see chart for details).    Right otalgia.  No indication of ear infection at this time.  Instructed patient to take Tylenol  or ibuprofen as needed for discomfort and to follow-up with his PCP if he is not improving.  Education provided on adult earache.  He agrees to plan of care.  Final Clinical Impressions(s) / UC Diagnoses   Final diagnoses:  Otalgia of right ear     Discharge Instructions      Take Tylenol  or ibuprofen as needed for discomfort.  Follow-up with your primary care provider if you are not improving.   ED Prescriptions   None    PDMP not reviewed this encounter.   Wellington Half, NP 05/09/24 1024

## 2024-05-09 NOTE — ED Triage Notes (Signed)
 SEEN ON 5/15 for the same.  Reports ear pain is worsening and in general having aches and pain.    Taking prescribed medications

## 2024-05-09 NOTE — Discharge Instructions (Addendum)
 Take Tylenol  or ibuprofen as needed for discomfort.  Follow-up with your primary care provider if you are not improving.

## 2024-06-21 DIAGNOSIS — R5383 Other fatigue: Secondary | ICD-10-CM | POA: Diagnosis not present

## 2024-06-21 DIAGNOSIS — H6991 Unspecified Eustachian tube disorder, right ear: Secondary | ICD-10-CM | POA: Diagnosis not present

## 2024-07-04 ENCOUNTER — Other Ambulatory Visit: Payer: Self-pay

## 2024-07-04 MED ORDER — VYVANSE 40 MG PO CAPS
40.0000 mg | ORAL_CAPSULE | Freq: Every morning | ORAL | 0 refills | Status: AC
  Filled 2024-07-04: qty 30, 30d supply, fill #0

## 2024-07-05 ENCOUNTER — Other Ambulatory Visit: Payer: Self-pay

## 2024-07-05 ENCOUNTER — Other Ambulatory Visit (HOSPITAL_BASED_OUTPATIENT_CLINIC_OR_DEPARTMENT_OTHER): Payer: Self-pay

## 2024-07-05 MED ORDER — VYVANSE 30 MG PO CAPS
30.0000 mg | ORAL_CAPSULE | Freq: Every morning | ORAL | 0 refills | Status: DC
  Filled 2024-07-05: qty 30, 30d supply, fill #0

## 2024-07-06 ENCOUNTER — Other Ambulatory Visit: Payer: Self-pay

## 2024-07-07 DIAGNOSIS — F900 Attention-deficit hyperactivity disorder, predominantly inattentive type: Secondary | ICD-10-CM | POA: Diagnosis not present

## 2024-07-07 DIAGNOSIS — F411 Generalized anxiety disorder: Secondary | ICD-10-CM | POA: Diagnosis not present

## 2024-07-07 DIAGNOSIS — F4310 Post-traumatic stress disorder, unspecified: Secondary | ICD-10-CM | POA: Diagnosis not present

## 2024-07-13 DIAGNOSIS — H6981 Other specified disorders of Eustachian tube, right ear: Secondary | ICD-10-CM | POA: Diagnosis not present

## 2024-07-14 DIAGNOSIS — G43709 Chronic migraine without aura, not intractable, without status migrainosus: Secondary | ICD-10-CM | POA: Diagnosis not present

## 2024-07-14 DIAGNOSIS — M7918 Myalgia, other site: Secondary | ICD-10-CM | POA: Diagnosis not present

## 2024-07-14 DIAGNOSIS — Z1331 Encounter for screening for depression: Secondary | ICD-10-CM | POA: Diagnosis not present

## 2024-07-15 ENCOUNTER — Other Ambulatory Visit: Payer: Self-pay | Admitting: Neurology

## 2024-07-15 DIAGNOSIS — G43709 Chronic migraine without aura, not intractable, without status migrainosus: Secondary | ICD-10-CM

## 2024-07-19 ENCOUNTER — Other Ambulatory Visit: Payer: Self-pay

## 2024-07-19 MED ORDER — LORAZEPAM 0.5 MG PO TABS
0.5000 mg | ORAL_TABLET | ORAL | 0 refills | Status: AC
  Filled 2024-07-19: qty 2, 1d supply, fill #0

## 2024-07-20 ENCOUNTER — Ambulatory Visit: Admission: RE | Admit: 2024-07-20 | Source: Ambulatory Visit

## 2024-07-20 ENCOUNTER — Ambulatory Visit
Admission: RE | Admit: 2024-07-20 | Discharge: 2024-07-20 | Disposition: A | Discharge status: Home / Self Care | Source: Ambulatory Visit | Attending: Neurology | Admitting: Neurology

## 2024-07-20 DIAGNOSIS — G43709 Chronic migraine without aura, not intractable, without status migrainosus: Secondary | ICD-10-CM | POA: Insufficient documentation

## 2024-07-20 DIAGNOSIS — G930 Cerebral cysts: Secondary | ICD-10-CM | POA: Diagnosis not present

## 2024-08-05 DIAGNOSIS — F410 Panic disorder [episodic paroxysmal anxiety] without agoraphobia: Secondary | ICD-10-CM | POA: Diagnosis not present

## 2024-08-13 ENCOUNTER — Other Ambulatory Visit: Payer: Self-pay

## 2024-08-16 ENCOUNTER — Other Ambulatory Visit: Payer: Self-pay

## 2024-08-16 DIAGNOSIS — F410 Panic disorder [episodic paroxysmal anxiety] without agoraphobia: Secondary | ICD-10-CM | POA: Diagnosis not present

## 2024-08-16 MED ORDER — LISDEXAMFETAMINE DIMESYLATE 30 MG PO CAPS
30.0000 mg | ORAL_CAPSULE | Freq: Every morning | ORAL | 0 refills | Status: DC
  Filled 2024-08-16: qty 30, 30d supply, fill #0

## 2024-08-26 DIAGNOSIS — F410 Panic disorder [episodic paroxysmal anxiety] without agoraphobia: Secondary | ICD-10-CM | POA: Diagnosis not present

## 2024-08-29 DIAGNOSIS — Z23 Encounter for immunization: Secondary | ICD-10-CM | POA: Diagnosis not present

## 2024-08-29 DIAGNOSIS — K219 Gastro-esophageal reflux disease without esophagitis: Secondary | ICD-10-CM | POA: Diagnosis not present

## 2024-08-29 DIAGNOSIS — K581 Irritable bowel syndrome with constipation: Secondary | ICD-10-CM | POA: Diagnosis not present

## 2024-08-29 DIAGNOSIS — I10 Essential (primary) hypertension: Secondary | ICD-10-CM | POA: Diagnosis not present

## 2024-08-30 DIAGNOSIS — F410 Panic disorder [episodic paroxysmal anxiety] without agoraphobia: Secondary | ICD-10-CM | POA: Diagnosis not present

## 2024-09-01 ENCOUNTER — Ambulatory Visit: Admission: EM | Admit: 2024-09-01 | Discharge: 2024-09-01 | Disposition: A | Discharge status: Home / Self Care

## 2024-09-01 ENCOUNTER — Encounter: Payer: Self-pay | Admitting: Emergency Medicine

## 2024-09-01 DIAGNOSIS — R310 Gross hematuria: Secondary | ICD-10-CM

## 2024-09-01 LAB — POCT URINE DIPSTICK
Bilirubin, UA: NEGATIVE
Glucose, UA: NEGATIVE mg/dL
Ketones, POC UA: NEGATIVE mg/dL
Leukocytes, UA: NEGATIVE
Nitrite, UA: NEGATIVE
POC PROTEIN,UA: NEGATIVE
Spec Grav, UA: 1.025 (ref 1.010–1.025)
Urobilinogen, UA: 0.2 U/dL
pH, UA: 5.5 (ref 5.0–8.0)

## 2024-09-01 NOTE — ED Provider Notes (Signed)
 [facility]  Note:  This document was prepared using Conservation officer, historic buildings and may include unintentional dictation errors.  MRN: [id number] DOB: Mar 03, 2001  Subjective:   Todd Stuart is a 23 y.o. male presenting for blood in his urine started earlier today.  Patient reports several bouts of hematuria since this morning.  Patient has history of of nephrolithiasis.  Patient reports previous kidney stones have not given him any significant pain and have passed without any significant problem.  Patient became worried when he saw blood in the urine.  Patient states that he did go for a 3 mile run this morning prior to the onset of symptoms but does not feel dehydrated and would drink plenty of fluids yesterday.  Patient has not taken any over-the-counter medication or home treatment for symptoms.  No current facility-administered medications for this encounter.  Current Outpatient Medications:    cloNIDine  (CATAPRES ) 0.1 MG tablet, Take 1 tablet (0.1 mg total) by mouth at bedtime., Disp: 90 tablet, Rfl: 3   FLUoxetine  (PROZAC ) 20 MG tablet, Take 1 tablet (20 mg total) by mouth daily. (Patient not taking: Reported on 10/02/2023), Disp: 90 tablet, Rfl: 0   fluticasone  (FLONASE ) 50 MCG/ACT nasal spray, Place 1 spray into both nostrils daily., Disp: 16 mL, Rfl: 0   Fluticasone -Salmeterol (ADVAIR) 250-50 MCG/DOSE AEPB, Inhale 1 puff into the lungs in the morning and at bedtime., Disp: 60 each, Rfl: 5   ipratropium (ATROVENT ) 0.03 % nasal spray, Place 2 sprays into both nostrils every 12 (twelve) hours., Disp: 30 mL, Rfl: 0   lisdexamfetamine (VYVANSE ) 30 MG capsule, Take 1 capsule by mouth every morning., Disp: , Rfl:    lisdexamfetamine (VYVANSE ) 30 MG capsule, Take 1 capsule (30 mg total) by mouth every morning., Disp: 30 capsule, Rfl: 0   lisinopril  (ZESTRIL ) 2.5 MG tablet, Take 1 tablet (2.5 mg total) by mouth daily. NEEDS OFFICE VISIT (Patient not taking: Reported on  10/02/2023), Disp: 30 tablet, Rfl: 0   LORazepam  (ATIVAN ) 0.5 MG tablet, Take 1 tablet (0.5 mg total) by mouth 60 minutes before MRI and 2nd tablet 15 minutes before MRI if needed., Disp: 2 tablet, Rfl: 0   ondansetron  (ZOFRAN ) 4 MG tablet, Take 1 tablet (4 mg total) by mouth every 8 (eight) hours as needed for nausea or vomiting., Disp: 20 tablet, Rfl: 1   tiZANidine  (ZANAFLEX ) 2 MG tablet, Take 1 tablet (2 mg total) by mouth at bedtime. (Patient not taking: Reported on 10/02/2023), Disp: 12 tablet, Rfl: 0   traZODone  (DESYREL ) 100 MG tablet, TAKE 2 TABLETS(200 MG) BY MOUTH AT BEDTIME, Disp: 60 tablet, Rfl: 0   VYVANSE  40 MG capsule, Take 1 capsule (40 mg total) by mouth every morning., Disp: 30 capsule, Rfl: 0   Allergies  Allergen Reactions   Indomethacin Itching, Nausea Only and Shortness Of Breath    Patient saw PCP who advised stopping medication.   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   Chronic pain syndrome    Connective tissue disease (HCC)    Depression    Disequilibrium    Headache    Hypertension    Hypertension    Panic attack    PTSD (post-traumatic stress disorder)      Past Surgical History:  Procedure Laterality  Date   TONSILLECTOMY AND ADENOIDECTOMY     WISDOM TOOTH EXTRACTION      Family History  Problem Relation Age of Onset   Depression Mother    Depression Father    Anxiety disorder Father    Headache Sister    Seizures Neg Hx    Bipolar disorder Neg Hx    Schizophrenia Neg Hx    ADD / ADHD Neg Hx    Autism Neg Hx     Social History   Tobacco Use   Smoking status: Never   Smokeless tobacco: Never  Vaping Use   Vaping status: Never Used  Substance Use Topics   Alcohol use: Never   Drug use: Never    ROS Refer to HPI for ROS details.  Objective:    Vitals: BP 121/86 (BP Location: Left Arm)   Pulse 76   Temp 98.1 F (36.7 C) (Oral)   Resp 16   SpO2 96%   Physical Exam Vitals and nursing note reviewed.  Constitutional:      General: He is not in acute distress.    Appearance: Normal appearance. He is well-developed. He is not ill-appearing or toxic-appearing.  HENT:     Head: Normocephalic.     Mouth/Throat:     Mouth: Mucous membranes are moist.  Cardiovascular:     Rate and Rhythm: Normal rate.  Pulmonary:     Effort: Pulmonary effort is normal. No respiratory distress.  Abdominal:     Palpations: Abdomen is soft.     Tenderness: There is no abdominal tenderness. There is no right CVA tenderness or left CVA tenderness.  Skin:    General: Skin is warm and dry.  Neurological:     General: No focal deficit present.     Mental Status: He is alert and oriented to person, place, and time.  Psychiatric:        Mood and Affect: Mood normal.        Behavior: Behavior normal.     Procedures  Results for orders placed or performed during the hospital encounter of 09/01/24 (from the past 24 hours)  POCT URINE DIPSTICK     Status: Abnormal   Collection Time: 09/01/24  2:22 PM  Result Value Ref Range   Color, UA yellow yellow   Clarity, UA clear clear   Glucose, UA negative negative mg/dL   Bilirubin, UA negative negative   Ketones, POC UA negative negative mg/dL   Spec Grav, UA 8.974 8.989 - 1.025   Blood, UA trace-intact (A) negative   pH, UA 5.5 5.0 - 8.0   POC PROTEIN,UA negative negative, trace   Urobilinogen, UA 0.2 0.2 or 1.0 E.U./dL   Nitrite, UA Negative Negative   Leukocytes, UA Negative Negative    No results found.   Assessment and Plan :     Discharge Instructions       1. Gross hematuria (Primary) - POCT URINE DIPSTICK completed in UC shows trace blood, no nitrite, no leukocytes, no significant sign of urinary infection. - Urine Culture collected in UC and sent to lab for further testing  results should be available in 2 to 3 days. -Continue to monitor symptoms for any change in severity if there is any escalation of current symptoms or development of new symptoms follow-up in ER for further evaluation and management.      Yissel Habermehl B Chrisanne Loose   Oline Belk, [place] B, [place] 09/01/24 1451

## 2024-09-01 NOTE — ED Triage Notes (Signed)
 Patient reports blood in urine that started today. Patient has history of kidney stones. Patient has not taken anything for symptoms.

## 2024-09-01 NOTE — Discharge Instructions (Signed)
  1. Gross hematuria (Primary) - POCT URINE DIPSTICK completed in UC shows trace blood, no nitrite, no leukocytes, no significant sign of urinary infection. - Urine Culture collected in UC and sent to lab for further testing results should be available in 2 to 3 days. -Continue to monitor symptoms for any change in severity if there is any escalation of current symptoms or development of new symptoms follow-up in ER for further evaluation and management.

## 2024-09-02 DIAGNOSIS — M791 Myalgia, unspecified site: Secondary | ICD-10-CM | POA: Diagnosis not present

## 2024-09-02 DIAGNOSIS — Z03818 Encounter for observation for suspected exposure to other biological agents ruled out: Secondary | ICD-10-CM | POA: Diagnosis not present

## 2024-09-02 DIAGNOSIS — R319 Hematuria, unspecified: Secondary | ICD-10-CM | POA: Diagnosis not present

## 2024-09-02 DIAGNOSIS — R5383 Other fatigue: Secondary | ICD-10-CM | POA: Diagnosis not present

## 2024-09-02 LAB — URINE CULTURE
Culture: NO GROWTH
Special Requests: NORMAL

## 2024-09-15 ENCOUNTER — Other Ambulatory Visit: Payer: Self-pay

## 2024-09-15 DIAGNOSIS — F410 Panic disorder [episodic paroxysmal anxiety] without agoraphobia: Secondary | ICD-10-CM | POA: Diagnosis not present

## 2024-09-15 MED ORDER — LISDEXAMFETAMINE DIMESYLATE 30 MG PO CAPS
30.0000 mg | ORAL_CAPSULE | Freq: Every morning | ORAL | 0 refills | Status: DC
  Filled 2024-09-15: qty 30, 30d supply, fill #0

## 2024-09-29 DIAGNOSIS — F410 Panic disorder [episodic paroxysmal anxiety] without agoraphobia: Secondary | ICD-10-CM | POA: Diagnosis not present

## 2024-09-30 ENCOUNTER — Other Ambulatory Visit: Payer: Self-pay

## 2024-09-30 MED ORDER — TRAZODONE HCL 100 MG PO TABS
100.0000 mg | ORAL_TABLET | Freq: Every evening | ORAL | 2 refills | Status: DC | PRN
  Filled 2024-09-30: qty 60, 30d supply, fill #0
  Filled 2024-11-02: qty 60, 30d supply, fill #1
  Filled 2024-12-05: qty 60, 30d supply, fill #2

## 2024-10-13 ENCOUNTER — Other Ambulatory Visit: Payer: Self-pay

## 2024-10-13 ENCOUNTER — Emergency Department (HOSPITAL_BASED_OUTPATIENT_CLINIC_OR_DEPARTMENT_OTHER)

## 2024-10-13 ENCOUNTER — Emergency Department (HOSPITAL_BASED_OUTPATIENT_CLINIC_OR_DEPARTMENT_OTHER)
Admission: EM | Admit: 2024-10-13 | Discharge: 2024-10-13 | Disposition: A | Discharge status: Home / Self Care | Attending: Emergency Medicine | Admitting: Emergency Medicine

## 2024-10-13 ENCOUNTER — Encounter (HOSPITAL_BASED_OUTPATIENT_CLINIC_OR_DEPARTMENT_OTHER): Payer: Self-pay

## 2024-10-13 ENCOUNTER — Emergency Department (HOSPITAL_BASED_OUTPATIENT_CLINIC_OR_DEPARTMENT_OTHER)
Admission: EM | Admit: 2024-10-13 | Discharge: 2024-10-13 | Disposition: A | Discharge status: Home / Self Care | Source: Home / Self Care | Attending: Emergency Medicine | Admitting: Emergency Medicine

## 2024-10-13 DIAGNOSIS — Z79899 Other long term (current) drug therapy: Secondary | ICD-10-CM | POA: Diagnosis not present

## 2024-10-13 DIAGNOSIS — I10 Essential (primary) hypertension: Secondary | ICD-10-CM | POA: Diagnosis not present

## 2024-10-13 DIAGNOSIS — N200 Calculus of kidney: Secondary | ICD-10-CM | POA: Diagnosis not present

## 2024-10-13 DIAGNOSIS — N132 Hydronephrosis with renal and ureteral calculous obstruction: Secondary | ICD-10-CM | POA: Insufficient documentation

## 2024-10-13 DIAGNOSIS — R10A1 Flank pain, right side: Secondary | ICD-10-CM | POA: Diagnosis present

## 2024-10-13 DIAGNOSIS — N202 Calculus of kidney with calculus of ureter: Secondary | ICD-10-CM | POA: Diagnosis not present

## 2024-10-13 DIAGNOSIS — F410 Panic disorder [episodic paroxysmal anxiety] without agoraphobia: Secondary | ICD-10-CM | POA: Diagnosis not present

## 2024-10-13 DIAGNOSIS — R109 Unspecified abdominal pain: Secondary | ICD-10-CM | POA: Diagnosis not present

## 2024-10-13 LAB — URINALYSIS, ROUTINE W REFLEX MICROSCOPIC
Bacteria, UA: NONE SEEN
Bacteria, UA: NONE SEEN
Bilirubin Urine: NEGATIVE
Bilirubin Urine: NEGATIVE
Glucose, UA: NEGATIVE mg/dL
Glucose, UA: NEGATIVE mg/dL
Ketones, ur: NEGATIVE mg/dL
Ketones, ur: NEGATIVE mg/dL
Leukocytes,Ua: NEGATIVE
Leukocytes,Ua: NEGATIVE
Nitrite: NEGATIVE
Nitrite: NEGATIVE
RBC / HPF: >50 RBC/hpf (ref 0–5)
Specific Gravity, Urine: 1.018 (ref 1.005–1.030)
Specific Gravity, Urine: 1.027 (ref 1.005–1.030)
pH: 5.5 (ref 5.0–8.0)
pH: 6 (ref 5.0–8.0)

## 2024-10-13 LAB — COMPREHENSIVE METABOLIC PANEL WITH GFR
ALT: 15 U/L (ref 0–44)
AST: 23 U/L (ref 15–41)
Albumin: 4.8 g/dL (ref 3.5–5.0)
Alkaline Phosphatase: 70 U/L (ref 38–126)
Anion gap: 10 (ref 5–15)
BUN: 25 mg/dL — ABNORMAL HIGH (ref 6–20)
CO2: 28 mmol/L (ref 22–32)
Calcium: 10.4 mg/dL — ABNORMAL HIGH (ref 8.9–10.3)
Chloride: 102 mmol/L (ref 98–111)
Creatinine, Ser: 1.12 mg/dL (ref 0.61–1.24)
GFR, Estimated: >60 mL/min (ref >60)
Glucose, Bld: 124 mg/dL — ABNORMAL HIGH (ref 70–99)
Potassium: 4.4 mmol/L (ref 3.5–5.1)
Sodium: 139 mmol/L (ref 135–145)
Total Bilirubin: 0.5 mg/dL (ref 0.0–1.2)
Total Protein: 7.2 g/dL (ref 6.5–8.1)

## 2024-10-13 LAB — CBC
HCT: 45.9 % (ref 39.0–52.0)
Hemoglobin: 14.9 g/dL (ref 13.0–17.0)
MCH: 26.5 pg (ref 26.0–34.0)
MCHC: 32.5 g/dL (ref 30.0–36.0)
MCV: 81.7 fL (ref 80.0–100.0)
Platelets: 271 K/uL (ref 150–400)
RBC: 5.62 MIL/uL (ref 4.22–5.81)
RDW: 12.2 % (ref 11.5–15.5)
WBC: 9.4 K/uL (ref 4.0–10.5)
nRBC: 0 % (ref 0.0–0.2)

## 2024-10-13 LAB — LIPASE, BLOOD: Lipase: 24 U/L (ref 11–51)

## 2024-10-13 MED ORDER — TAMSULOSIN HCL 0.4 MG PO CAPS: 0.4000 mg | ORAL_CAPSULE | Freq: Every day | ORAL | 0 refills | Status: DC

## 2024-10-13 MED ORDER — SODIUM CHLORIDE 0.9 % IV BOLUS
1000.0000 mL | Freq: Once | INTRAVENOUS | Status: AC
  Administered 2024-10-13: 1000 mL via INTRAVENOUS

## 2024-10-13 MED ORDER — KETOROLAC TROMETHAMINE 15 MG/ML IJ SOLN
15.0000 mg | Freq: Once | INTRAMUSCULAR | Status: AC
  Administered 2024-10-13: 15 mg via INTRAVENOUS
  Filled 2024-10-13: qty 1

## 2024-10-13 MED ORDER — ONDANSETRON HCL 4 MG/2ML IJ SOLN
4.0000 mg | Freq: Once | INTRAMUSCULAR | Status: AC
Start: 2024-10-13 — End: 2024-10-13
  Administered 2024-10-13: 4 mg via INTRAVENOUS
  Filled 2024-10-13: qty 2

## 2024-10-13 MED ORDER — MORPHINE SULFATE (PF) 4 MG/ML IV SOLN
4.0000 mg | Freq: Once | INTRAVENOUS | Status: AC
  Administered 2024-10-13: 4 mg via INTRAVENOUS
  Filled 2024-10-13: qty 1

## 2024-10-13 MED ORDER — FENTANYL CITRATE (PF) 50 MCG/ML IJ SOSY
50.0000 ug | PREFILLED_SYRINGE | Freq: Once | INTRAMUSCULAR | Status: DC
Start: 2024-10-13 — End: 2024-10-13

## 2024-10-13 MED ORDER — MAGNESIUM SULFATE 2 GM/50ML IV SOLN
2.0000 g | Freq: Once | INTRAVENOUS | Status: AC
  Administered 2024-10-13: 2 g via INTRAVENOUS
  Filled 2024-10-13: qty 50

## 2024-10-13 MED ORDER — LACTATED RINGERS IV BOLUS
1000.0000 mL | Freq: Once | INTRAVENOUS | Status: AC
  Administered 2024-10-13: 1000 mL via INTRAVENOUS

## 2024-10-13 MED ORDER — KETOROLAC TROMETHAMINE 10 MG PO TABS: 10.0000 mg | ORAL_TABLET | Freq: Four times a day (QID) | ORAL | 0 refills | Status: AC | PRN

## 2024-10-13 MED ORDER — PROMETHAZINE HCL 25 MG PO TABS: 25.0000 mg | ORAL_TABLET | Freq: Four times a day (QID) | ORAL | 0 refills | Status: AC | PRN

## 2024-10-13 MED ORDER — HYDROCODONE-ACETAMINOPHEN 5-325 MG PO TABS: 1.0000 | ORAL_TABLET | Freq: Four times a day (QID) | ORAL | 0 refills | Status: DC | PRN

## 2024-10-13 MED ORDER — ONDANSETRON HCL 4 MG/2ML IJ SOLN
4.0000 mg | Freq: Once | INTRAMUSCULAR | Status: AC
  Administered 2024-10-13: 4 mg via INTRAVENOUS
  Filled 2024-10-13: qty 2

## 2024-10-13 MED ORDER — PROMETHAZINE HCL 25 MG PO TABS: 25.0000 mg | ORAL_TABLET | Freq: Four times a day (QID) | ORAL | 0 refills | Status: DC | PRN

## 2024-10-13 MED ORDER — KETOROLAC TROMETHAMINE 30 MG/ML IJ SOLN
30.0000 mg | Freq: Once | INTRAMUSCULAR | Status: AC
  Administered 2024-10-13: 30 mg via INTRAVENOUS
  Filled 2024-10-13: qty 1

## 2024-10-13 MED ORDER — TAMSULOSIN HCL 0.4 MG PO CAPS
0.4000 mg | ORAL_CAPSULE | Freq: Once | ORAL | Status: AC
  Administered 2024-10-13: 0.4 mg via ORAL
  Filled 2024-10-13: qty 1

## 2024-10-13 MED ORDER — HYDROCODONE-ACETAMINOPHEN 5-325 MG PO TABS
1.0000 | ORAL_TABLET | Freq: Four times a day (QID) | ORAL | 0 refills | Status: AC | PRN
  Filled 2024-10-13: qty 15, 2d supply, fill #0

## 2024-10-13 MED ORDER — HYDROMORPHONE HCL 1 MG/ML IJ SOLN
1.0000 mg | Freq: Once | INTRAMUSCULAR | Status: AC
  Administered 2024-10-13: 1 mg via INTRAVENOUS
  Filled 2024-10-13: qty 1

## 2024-10-13 MED ORDER — PROCHLORPERAZINE MALEATE 10 MG PO TABS
10.0000 mg | ORAL_TABLET | Freq: Once | ORAL | Status: AC
  Administered 2024-10-13: 10 mg via ORAL
  Filled 2024-10-13: qty 1

## 2024-10-13 MED ORDER — SODIUM CHLORIDE 0.9 % IV SOLN
12.5000 mg | Freq: Four times a day (QID) | INTRAVENOUS | Status: DC | PRN
  Administered 2024-10-13: 12.5 mg via INTRAVENOUS
  Filled 2024-10-13: qty 0.5

## 2024-10-13 MED ORDER — FENTANYL CITRATE (PF) 50 MCG/ML IJ SOSY
50.0000 ug | PREFILLED_SYRINGE | Freq: Once | INTRAMUSCULAR | Status: AC
  Administered 2024-10-13: 50 ug via INTRAVENOUS
  Filled 2024-10-13: qty 1

## 2024-10-13 MED ORDER — TAMSULOSIN HCL 0.4 MG PO CAPS
0.4000 mg | ORAL_CAPSULE | Freq: Every day | ORAL | 0 refills | Status: AC
  Filled 2024-10-13 (×2): qty 10, 10d supply, fill #0

## 2024-10-13 MED ORDER — PROMETHAZINE HCL 25 MG/ML IJ SOLN
INTRAMUSCULAR | Status: AC
  Filled 2024-10-13: qty 1

## 2024-10-13 MED ORDER — KETOROLAC TROMETHAMINE 10 MG PO TABS: 10.0000 mg | ORAL_TABLET | Freq: Four times a day (QID) | ORAL | 0 refills | Status: DC | PRN

## 2024-10-13 NOTE — Discharge Instructions (Addendum)
 You were evaluated in the emergency room for kidney stone pain.  Your lab work did not show any significant abnormality.  Your ultrasound was consistent with mild swelling of your kidney.  Your stone is approximately 3 to 4 mm and expected to pass.  A prescription for Toradol  and Phenergan  was sent into your pharmacy for pain and nausea.  You may use this in addition to the hydrocodone and Flomax that been sent into your pharmacy.  Please call the number that sheet to follow-up with the urologist tomorrow.

## 2024-10-13 NOTE — ED Notes (Signed)
 Pt was not able to urinate, when ready knows to use call bell and I will assist him.

## 2024-10-13 NOTE — ED Triage Notes (Signed)
 Right flank pain, seen here yesterday. Diagnosed with kidney stone. Reports the pain in unbearable today even with 2 hydrocodone which were prescribed. Says now he has a burning sensation which is new. Tachycardic in triage.

## 2024-10-13 NOTE — ED Notes (Signed)
 Patient still actively vomiting when this RN went to administer Flomax. This RN messaged provider to request additional nausea medication prior to Flomax.

## 2024-10-13 NOTE — ED Notes (Signed)
Pt aware urine specimen ordered. Pt reports inability to provide specimen at this time. Specimen collection device provided to patient. 

## 2024-10-13 NOTE — ED Provider Notes (Signed)
 [place] EMERGENCY DEPARTMENT AT [facility] Provider Note   CSN: [id number] Arrival date & time: 10/13/24  0010     Patient presents with: Flank Pain   Todd Stuart is a 23 y.o. male.   Patient is a 23 year old male with history of migraines, prior kidney stone, ADHD.  Patient presenting today with complaints of right flank pain.  Symptoms began this evening shortly after eating dinner.  He describes severe pain that radiates into his testicle and urethra.  He describes an urgency to urinate, but no blood in his urine.  No fevers or chills.       Prior to Admission medications   Medication Sig Start Date End Date Taking? Authorizing Provider  cloNIDine  (CATAPRES ) 0.1 MG tablet Take 1 tablet (0.1 mg total) by mouth at bedtime. 03/23/20   Jimmy Charlie FERNS, MD  FLUoxetine  (PROZAC ) 20 MG tablet Take 1 tablet (20 mg total) by mouth daily. Patient not taking: Reported on 10/02/2023 08/16/20   Jimmy Charlie FERNS, MD  fluticasone  (FLONASE ) 50 MCG/ACT nasal spray Place 1 spray into both nostrils daily. 10/02/23   Defelice, Jeanette, NP  Fluticasone -Salmeterol (ADVAIR) 250-50 MCG/DOSE AEPB Inhale 1 puff into the lungs in the morning and at bedtime. 05/22/20   Letvak, Richard I, MD  ipratropium (ATROVENT ) 0.03 % nasal spray Place 2 sprays into both nostrils every 12 (twelve) hours. 04/28/24   White, Shelba SAUNDERS, NP  lisdexamfetamine (VYVANSE ) 30 MG capsule Take 1 capsule by mouth every morning. 07/21/23   [provider]  lisdexamfetamine (VYVANSE ) 30 MG capsule Take 1 capsule (30 mg total) by mouth every morning. 09/15/24     lisinopril  (ZESTRIL ) 2.5 MG tablet Take 1 tablet (2.5 mg total) by mouth daily. NEEDS OFFICE VISIT Patient not taking: Reported on 10/02/2023 05/27/21   Jimmy Charlie FERNS, MD  LORazepam  (ATIVAN ) 0.5 MG tablet Take 1 tablet (0.5 mg total) by mouth 60 minutes before MRI and 2nd tablet 15 minutes before MRI if needed. 07/19/24   Maree Jannett POUR, MD  ondansetron   (ZOFRAN ) 4 MG tablet Take 1 tablet (4 mg total) by mouth every 8 (eight) hours as needed for nausea or vomiting. 03/25/20   Jimmy Charlie FERNS, MD  tiZANidine  (ZANAFLEX ) 2 MG tablet Take 1 tablet (2 mg total) by mouth at bedtime. Patient not taking: Reported on 10/02/2023 08/12/22   Arloa Suzen RAMAN, NP  traZODone  (DESYREL ) 100 MG tablet TAKE 2 TABLETS(200 MG) BY MOUTH AT BEDTIME 09/13/20   Jimmy Charlie FERNS, MD  traZODone  (DESYREL ) 100 MG tablet Take 1-2 tablets (100-200 mg total) by mouth at bedtime as needed for sleep. 09/30/24     VYVANSE  40 MG capsule Take 1 capsule (40 mg total) by mouth every morning. 07/04/24       Allergies: Indomethacin and Doxycycline    Review of Systems  All other systems reviewed and are negative.   Updated Vital Signs BP (!) 157/90   Pulse 100   Temp 97.9 F (36.6 C)   Resp 17   SpO2 100%   Physical Exam Vitals and nursing note reviewed.  Constitutional:      General: He is not in acute distress.    Appearance: He is well-developed. He is not diaphoretic.  HENT:     Head: Normocephalic and atraumatic.  Cardiovascular:     Rate and Rhythm: Normal rate and regular rhythm.     Heart sounds: No murmur heard.    No friction rub.  Pulmonary:  Effort: Pulmonary effort is normal. No respiratory distress.     Breath sounds: Normal breath sounds. No wheezing or rales.  Abdominal:     General: Bowel sounds are normal. There is no distension.     Palpations: Abdomen is soft.     Tenderness: There is no abdominal tenderness.  Musculoskeletal:        General: Normal range of motion.     Cervical back: Normal range of motion and neck supple.  Skin:    General: Skin is warm and dry.  Neurological:     Mental Status: He is alert and oriented to person, place, and time.     Coordination: Coordination normal.     (all labs ordered are listed, but only abnormal results are displayed) Labs Reviewed  URINALYSIS, ROUTINE W REFLEX MICROSCOPIC - Abnormal;  Notable for the following components:      Result Value   Hgb urine dipstick LARGE (*)    Protein, ur TRACE (*)    All other components within normal limits    EKG: None  Radiology: CT Renal Stone Study Result Date: 10/13/2024 EXAM: CT ABDOMEN AND PELVIS WITHOUT CONTRAST 10/13/2024 01:16:53 AM TECHNIQUE: CT of the abdomen and pelvis was performed without the administration of intravenous contrast. Multiplanar reformatted images are provided for review. Automated exposure control, iterative reconstruction, and/or weight-based adjustment of the mA/kV was utilized to reduce the radiation dose to as low as reasonably achievable. COMPARISON: 04/10/2024 CLINICAL HISTORY: Abdominal/flank pain, stone suspected. Right flank pain. Burning pain down urethra with urination. HX stones. FINDINGS: LOWER CHEST: No acute abnormality. LIVER: The liver is unremarkable. GALLBLADDER AND BILE DUCTS: Gallbladder is unremarkable. No biliary ductal dilatation. SPLEEN: No acute abnormality. PANCREAS: No acute abnormality. ADRENAL GLANDS: No acute abnormality. KIDNEYS, URETERS AND BLADDER: Bilateral punctate nephrolithiasis. 3 mm stone at the left UVJ. No hydronephrosis. No perinephric or periureteral stranding. Urinary bladder decompressed. GI AND BOWEL: Stomach demonstrates no acute abnormality. Moderate stool burden throughout the colon. There is no bowel obstruction. PERITONEUM AND RETROPERITONEUM: No ascites. No free air. VASCULATURE: Aorta is normal in caliber. LYMPH NODES: No lymphadenopathy. REPRODUCTIVE ORGANS: No acute abnormality. 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 No focal soft tissue abnormality. IMPRESSION: 1. Left ureterovesical junction 3 mm calculus without hydronephrosis. 2. Bilateral nephrolithiasis. 3. Moderate stool burden Electronically signed by: Franky Crease MD 10/13/2024 01:22 AM EDT RP Workstation: [id number]     Procedures   Medications  Ordered in the ED  ketorolac  (TORADOL ) 30 MG/ML injection 30 mg (has no administration in time range)  morphine (PF) 4 MG/ML injection 4 mg (has no administration in time range)  ondansetron  (ZOFRAN ) injection 4 mg (has no administration in time range)                                    Medical Decision Making Amount and/or Complexity of Data Reviewed Labs: ordered. Radiology: ordered.  Risk Prescription drug management.   Patient is a 23 year old male presenting with flank pain as described in the HPI.  He arrives here with stable vital signs and is afebrile.  Abdomen is benign, but he does have some CVA tenderness on the right.  Urinalysis shows large blood, but no evidence for infection.  CT with renal protocol shows a 2 to 3 mm calculus at the right UVJ.  He has received Toradol  and morphine for pain  and seems to be feeling significantly improved.  He will be discharged with pain medication, Flomax, and follow-up as needed.     Final diagnoses:  None    ED Discharge Orders     None          Geroldine Berg, MD 10/13/24 [phone]

## 2024-10-13 NOTE — Discharge Instructions (Signed)
 Begin taking hydrocodone as prescribed as needed for pain.  Begin taking Flomax as prescribed.  Follow-up with urology if your symptoms are not improving in the next 3 to 4 days.  The contact information for [facility] has been provided in this discharge summary for you to call and make these arrangements.

## 2024-10-13 NOTE — ED Triage Notes (Signed)
 Right flank pain. Burning pain down urethra with urination. HX stones.

## 2024-10-13 NOTE — ED Notes (Signed)
 Pt resting comfortably in room. Respirations even and unlabored. Discharge instructions reviewed with pt and family at bedside. No further questions at this time.

## 2024-10-13 NOTE — ED Provider Notes (Signed)
 [place] EMERGENCY DEPARTMENT AT [facility] Provider Note   CSN: [id number] Arrival date & time: 10/13/24  9042     Patient presents with: Flank Pain   Todd Stuart is a 23 y.o. male with history of nephrolithiasis presents with complaints of flank and abdominal pain.  Was diagnosed with 3 mm urolithiasis at UVJ last night.  Reports this morning around 730 the pain became unbearable.  Has had no relief with oxycodone 5 times daily.  Describes it well to urinating.  No vomiting.    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 Authorizing Provider  cloNIDine  (CATAPRES ) 0.1 MG tablet Take 1 tablet (0.1 mg total) by mouth at bedtime. 03/23/20   Jimmy Charlie FERNS, MD  FLUoxetine  (PROZAC ) 20 MG tablet Take 1 tablet (20 mg total) by mouth daily. Patient not taking: Reported on 10/02/2023 08/16/20   Jimmy Charlie FERNS, MD  fluticasone  (FLONASE ) 50 MCG/ACT nasal spray Place 1 spray into both nostrils daily. 10/02/23   Defelice, Jeanette, NP  Fluticasone -Salmeterol (ADVAIR) 250-50 MCG/DOSE AEPB Inhale 1 puff into the lungs in the morning and at bedtime. 05/22/20   Jimmy Charlie FERNS, MD  HYDROcodone-acetaminophen  (NORCO/VICODIN) 5-325 MG tablet Take 1-2 tablets by mouth every 6 (six) hours as needed. 10/13/24   Geroldine Berg, MD  ipratropium (ATROVENT ) 0.03 % nasal spray Place 2 sprays into both nostrils every 12 (twelve)  hours. 04/28/24   White, Shelba SAUNDERS, NP  ketorolac  (TORADOL ) 10 MG tablet Take 1 tablet (10 mg total) by mouth every 6 (six) hours as needed. 10/13/24   Keith, Kayla N, PA-C  lisdexamfetamine (VYVANSE ) 30 MG capsule Take 1 capsule by mouth every morning. 07/21/23   [provider]  lisdexamfetamine (VYVANSE ) 30 MG capsule Take 1 capsule (30 mg total) by mouth every morning. 09/15/24     lisinopril  (ZESTRIL ) 2.5 MG tablet Take 1 tablet (2.5 mg total) by mouth daily. NEEDS OFFICE VISIT Patient not taking: Reported on 10/02/2023 05/27/21   Jimmy Charlie FERNS, MD  LORazepam  (ATIVAN ) 0.5 MG tablet Take 1 tablet (0.5 mg total) by mouth 60 minutes before MRI and 2nd tablet 15 minutes before MRI if needed. 07/19/24   Maree Jannett POUR, MD  ondansetron  (ZOFRAN ) 4 MG tablet Take 1 tablet (4 mg total) by mouth every 8 (eight) hours as needed for nausea or vomiting. 03/25/20   Jimmy Charlie FERNS, MD  promethazine  (PHENERGAN ) 25 MG tablet Take 1 tablet (25 mg total) by mouth every 6 (six) hours as needed for nausea or vomiting. 10/13/24   Francis Ileana SAILOR, PA-C  tamsulosin (FLOMAX) 0.4 MG CAPS capsule Take 1 capsule (0.4 mg total) by mouth daily. 10/13/24   Geroldine Berg, MD  tiZANidine  (ZANAFLEX ) 2 MG tablet Take 1 tablet (2 mg total) by mouth at bedtime.  Patient not taking: Reported on 10/02/2023 08/12/22   Arloa Suzen RAMAN, NP  traZODone  (DESYREL ) 100 MG tablet TAKE 2 TABLETS(200 MG) BY MOUTH AT BEDTIME 09/13/20   Jimmy Charlie FERNS, MD  traZODone  (DESYREL ) 100 MG tablet Take 1-2 tablets (100-200 mg total) by mouth at bedtime as needed for sleep. 09/30/24     VYVANSE  40 MG capsule Take 1 capsule (40 mg total) by mouth every morning. 07/04/24       Allergies: Indomethacin and Doxycycline    Review of Systems  Genitourinary:  Positive for flank pain.    Updated Vital Signs BP (!) 122/92 (BP Location: Right Arm)   Pulse 72   Temp 97.9 F (36.6 C) (Oral)   Resp 16   SpO2 100%   Physical Exam Vitals and  nursing note reviewed.  Constitutional:      Appearance: He is well-developed.     Comments: Appears uncomfortable  HENT:     Head: Normocephalic and atraumatic.  Eyes:     Conjunctiva/sclera: Conjunctivae normal.  Cardiovascular:     Rate and Rhythm: Normal rate and regular rhythm.     Heart sounds: No murmur heard. Pulmonary:     Effort: Pulmonary effort is normal. No respiratory distress.     Breath sounds: Normal breath sounds.  Abdominal:     Palpations: Abdomen is soft.     Tenderness: There is abdominal tenderness. There is no left CVA tenderness.     Comments: Left lower quadrant tenderness  Genitourinary:    Testes: Normal.     Comments: No scrotal tenderness, swelling, intact cremasteric Musculoskeletal:        General: No swelling.     Cervical back: Neck supple.  Skin:    General: Skin is warm and dry.     Capillary Refill: Capillary refill takes less than 2 seconds.  Neurological:     Mental Status: He is alert.  Psychiatric:        Mood and Affect: Mood normal.     (all labs ordered are listed, but only abnormal results are displayed) Labs Reviewed  COMPREHENSIVE METABOLIC PANEL WITH GFR - Abnormal; Notable for the following components:      Result Value   Glucose, Bld 124 (*)    BUN 25 (*)    Calcium 10.4 (*)    All other components within normal limits  URINALYSIS, ROUTINE W REFLEX MICROSCOPIC - Abnormal; Notable for the following components:   Hgb urine dipstick LARGE (*)    Protein, ur TRACE (*)    All other components within normal limits  LIPASE, BLOOD  CBC    EKG: None  Radiology: No results found.    Procedures   Medications Ordered in the ED  lactated ringers bolus 1,000 mL (0 mLs Intravenous Stopped 10/13/24 1159)  ondansetron  (ZOFRAN ) injection 4 mg (4 mg Intravenous Given 10/13/24 1058)  fentaNYL (SUBLIMAZE) injection 50 mcg (50 mcg Intravenous Given 10/13/24 1058)  magnesium sulfate IVPB 2 g 50 mL (0 g Intravenous Stopped  10/13/24 1109)  ketorolac  (TORADOL ) 15 MG/ML injection 15 mg (15 mg Intravenous Given 10/13/24 1057)  HYDROmorphone (DILAUDID) injection 1 mg (1 mg Intravenous Given 10/13/24 1233)  prochlorperazine (COMPAZINE) tablet 10 mg (10 mg Oral Given 10/13/24 1340)  tamsulosin (FLOMAX) capsule 0.4 mg (0.4 mg Oral Given 10/13/24 1847)  ondansetron  (ZOFRAN ) injection 4 mg (4 mg Intravenous Given 10/13/24 1449)  morphine (PF) 4 MG/ML injection 4 mg (4 mg Intravenous Given 10/13/24 1524)  sodium chloride 0.9 %  bolus 1,000 mL (0 mLs Intravenous Stopped 10/13/24 1837)  ketorolac  (TORADOL ) 15 MG/ML injection 15 mg (15 mg Intravenous Given 10/13/24 1840)    Clinical Course as of 10/15/24 1651  Thu Oct 13, 2024  1042 Patient recently diagnosed last night with 3 mm urolithiasis at UVJ without hydronephrosis presents with intractable left-sided flank and lower abdominal pain since early this morning.  Upon arrival patient is tachycardic and appears uncomfortable.  He has positive left CVAT and left lower quadrant abdominal tenderness.  His testicles are with an even lie without any tenderness or swelling.  Intact cremasteric.  Will obtain routine labs and administer analgesia.  Given the size of the stone we will hold off on repeating imaging. [JT]  1045 CBC Unremarkable [JT]  1150 Patient reevaluated, reports improvement of pain.  Will continue to monitor [JT]  1219 Urinalysis, Routine w reflex microscopic -Urine, Clean Catch(!) Large amount of hemoglobin, no bacteria, negative nitrites or leukocytes [JT]  1219 Lipase, blood Within normal limit [JT]  1219 Comprehensive metabolic panel(!) No significant abnormality [JT]  1416 Pain is improved.  Patient feeling nauseous from Dilaudid.  Compazine given.  Still unable to urinate.  Will obtain renal ultrasound administer dose of Flomax. [JT]  1654 Discussed patient with Dr. Devere.  Recommended discharge home with prescription for Toradol  and Flomax.  Recommended  calling the office tomorrow for close follow-up. [JT]  1824 Patient reevaluated.  Reports marked improvement of nausea and vomiting following Phenergan  and IV fluids.  He reports pain is somewhat manageable at this point.  It has been 6 hours since his last dose of Toradol .  Will administer another dose before discharge home.  He will call urology tomorrow for appointment.   [JT]    Clinical Course User Index [JT] Donnajean Lynwood DEL, PA-C                                 Medical Decision Making Amount and/or Complexity of Data Reviewed Labs: ordered. Decision-making details documented in ED Course. Radiology: ordered.  Risk Prescription drug management.   This patient presents to the ED with chief complaint(s) of flank pain.  The complaint involves an extensive differential diagnosis and also carries with it a high risk of complications and morbidity.   Pertinent past medical history as listed in HPI  The differential diagnosis includes  Exam is not consistent with testicular torsion.   Additional history obtained: Records reviewed Care Everywhere/External Records  Disposition:   Patient will be discharged home. The patient has been appropriately medically screened and/or stabilized in the ED. I have low suspicion for any other emergent medical condition which would require further screening, evaluation or treatment in the ED or require inpatient management. At time of discharge the patient is hemodynamically stable and in no acute distress. I have discussed work-up results and diagnosis with patient and answered all questions. Patient is agreeable with discharge plan. We discussed strict return precautions for returning to the emergency department and they verbalized understanding.     Social Determinants of Health:   none  This note was dictated with voice recognition software.  Despite best efforts at proofreading, errors may have occurred which can change the documentation meaning.        Final diagnoses:  Calculus of kidney    ED Discharge Orders          Ordered    promethazine  (PHENERGAN ) 25 MG tablet  Every 6 hours PRN,   Status:  Discontinued        10/13/24 1822    ketorolac  (TORADOL ) 10 MG tablet  Every 6 hours PRN,   Status:  Discontinued        10/13/24 1822    ketorolac  (TORADOL ) 10 MG tablet  Every 6 hours PRN        10/13/24 2020    promethazine  (PHENERGAN ) 25 MG tablet  Every 6 hours PRN        10/13/24 2020               Donnajean Lynwood DEL, PA-C 10/15/24 1651    Rogelia Jerilynn RAMAN, MD 10/16/24 1743

## 2024-10-14 ENCOUNTER — Other Ambulatory Visit: Payer: Self-pay

## 2024-10-14 ENCOUNTER — Other Ambulatory Visit (HOSPITAL_BASED_OUTPATIENT_CLINIC_OR_DEPARTMENT_OTHER): Payer: Self-pay

## 2024-10-14 DIAGNOSIS — N23 Unspecified renal colic: Secondary | ICD-10-CM | POA: Diagnosis not present

## 2024-10-14 DIAGNOSIS — N132 Hydronephrosis with renal and ureteral calculous obstruction: Secondary | ICD-10-CM | POA: Diagnosis not present

## 2024-10-14 DIAGNOSIS — N201 Calculus of ureter: Secondary | ICD-10-CM | POA: Diagnosis not present

## 2024-10-14 DIAGNOSIS — R110 Nausea: Secondary | ICD-10-CM | POA: Diagnosis not present

## 2024-10-14 DIAGNOSIS — N202 Calculus of kidney with calculus of ureter: Secondary | ICD-10-CM | POA: Diagnosis not present

## 2024-10-14 DIAGNOSIS — R399 Unspecified symptoms and signs involving the genitourinary system: Secondary | ICD-10-CM | POA: Diagnosis not present

## 2024-10-20 ENCOUNTER — Other Ambulatory Visit: Payer: Self-pay

## 2024-10-20 MED ORDER — LISDEXAMFETAMINE DIMESYLATE 30 MG PO CAPS
30.0000 mg | ORAL_CAPSULE | Freq: Every morning | ORAL | 0 refills | Status: DC
  Filled 2024-10-20: qty 30, 30d supply, fill #0

## 2024-10-21 ENCOUNTER — Other Ambulatory Visit: Payer: Self-pay

## 2024-10-21 DIAGNOSIS — N200 Calculus of kidney: Secondary | ICD-10-CM | POA: Diagnosis not present

## 2024-10-22 ENCOUNTER — Other Ambulatory Visit: Payer: Self-pay

## 2024-10-22 ENCOUNTER — Other Ambulatory Visit (HOSPITAL_COMMUNITY): Payer: Self-pay

## 2024-10-24 DIAGNOSIS — F410 Panic disorder [episodic paroxysmal anxiety] without agoraphobia: Secondary | ICD-10-CM | POA: Diagnosis not present

## 2024-11-07 DIAGNOSIS — F410 Panic disorder [episodic paroxysmal anxiety] without agoraphobia: Secondary | ICD-10-CM | POA: Diagnosis not present

## 2024-11-20 ENCOUNTER — Other Ambulatory Visit: Payer: Self-pay

## 2024-11-21 ENCOUNTER — Other Ambulatory Visit: Payer: Self-pay

## 2024-11-21 MED ORDER — LISDEXAMFETAMINE DIMESYLATE 30 MG PO CAPS
30.0000 mg | ORAL_CAPSULE | Freq: Every morning | ORAL | 0 refills | Status: DC
  Filled 2024-11-21: qty 30, 30d supply, fill #0

## 2024-11-22 ENCOUNTER — Other Ambulatory Visit: Payer: Self-pay

## 2024-11-26 ENCOUNTER — Other Ambulatory Visit: Payer: Self-pay

## 2024-12-21 ENCOUNTER — Other Ambulatory Visit: Payer: Self-pay

## 2024-12-21 MED ORDER — AZITHROMYCIN 250 MG PO TABS
ORAL_TABLET | ORAL | 0 refills | Status: AC
  Filled 2024-12-21: qty 6, 5d supply, fill #0

## 2024-12-21 MED ORDER — OSELTAMIVIR PHOSPHATE 75 MG PO CAPS
75.0000 mg | ORAL_CAPSULE | Freq: Two times a day (BID) | ORAL | 0 refills | Status: AC
  Filled 2024-12-21: qty 10, 5d supply, fill #0

## 2024-12-21 MED ORDER — PREDNISONE 10 MG PO TABS
10.0000 mg | ORAL_TABLET | Freq: Two times a day (BID) | ORAL | 0 refills | Status: AC
  Filled 2024-12-21: qty 10, 5d supply, fill #0

## 2024-12-21 MED ORDER — PROMETHAZINE-DM 6.25-15 MG/5ML PO SYRP
5.0000 mL | ORAL_SOLUTION | Freq: Four times a day (QID) | ORAL | 0 refills | Status: AC | PRN
  Filled 2024-12-21: qty 60, 3d supply, fill #0

## 2025-01-01 ENCOUNTER — Other Ambulatory Visit: Payer: Self-pay

## 2025-01-02 ENCOUNTER — Other Ambulatory Visit: Payer: Self-pay

## 2025-01-02 MED ORDER — LISDEXAMFETAMINE DIMESYLATE 30 MG PO CAPS
30.0000 mg | ORAL_CAPSULE | Freq: Every morning | ORAL | 0 refills | Status: AC
  Filled 2025-01-02: qty 30, 30d supply, fill #0

## 2025-01-03 ENCOUNTER — Other Ambulatory Visit: Payer: Self-pay

## 2025-01-03 MED ORDER — TRAZODONE HCL 100 MG PO TABS
100.0000 mg | ORAL_TABLET | Freq: Every evening | ORAL | 2 refills | Status: AC | PRN
  Filled 2025-01-03: qty 60, 30d supply, fill #0
